# Patient Record
Sex: Female | Born: 1990 | Race: White | Hispanic: No | Smoking: Current every day smoker
Health system: Southern US, Community
[De-identification: ages and names within clinical notes are randomized; demographics above are authoritative.]

## PROBLEM LIST (undated history)

## (undated) DIAGNOSIS — D649 Anemia, unspecified: Secondary | ICD-10-CM

## (undated) DIAGNOSIS — J45909 Unspecified asthma, uncomplicated: Secondary | ICD-10-CM

## (undated) DIAGNOSIS — G43909 Migraine, unspecified, not intractable, without status migrainosus: Secondary | ICD-10-CM

## (undated) DIAGNOSIS — F99 Mental disorder, not otherwise specified: Secondary | ICD-10-CM

## (undated) DIAGNOSIS — M5430 Sciatica, unspecified side: Secondary | ICD-10-CM

## (undated) DIAGNOSIS — M549 Dorsalgia, unspecified: Secondary | ICD-10-CM

## (undated) HISTORY — PX: GANGLION CYST EXCISION: SHX1691

## (undated) HISTORY — DX: Mental disorder, not otherwise specified: F99

## (undated) HISTORY — PX: ADENOIDECTOMY: SUR15

## (undated) HISTORY — PX: TONSILLECTOMY: SUR1361

## (undated) HISTORY — DX: Anemia, unspecified: D64.9

## (undated) HISTORY — PX: MYRINGOTOMY WITH TUBE PLACEMENT: SHX5663

---
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
## 2011-02-17 ENCOUNTER — Emergency Department: Payer: Self-pay | Admitting: Emergency Medicine

## 2011-03-09 ENCOUNTER — Emergency Department: Payer: Self-pay | Admitting: Emergency Medicine

## 2011-03-19 ENCOUNTER — Emergency Department: Payer: Self-pay | Admitting: Internal Medicine

## 2011-05-08 ENCOUNTER — Emergency Department: Payer: Self-pay | Admitting: Emergency Medicine

## 2011-06-01 ENCOUNTER — Emergency Department: Payer: Self-pay | Admitting: *Deleted

## 2011-08-01 ENCOUNTER — Emergency Department: Payer: Self-pay | Admitting: Emergency Medicine

## 2011-09-15 ENCOUNTER — Emergency Department: Payer: Self-pay | Admitting: Emergency Medicine

## 2011-11-25 ENCOUNTER — Emergency Department: Payer: Self-pay | Admitting: Emergency Medicine

## 2011-11-25 LAB — PREGNANCY, URINE: Pregnancy Test, Urine: NEGATIVE m[IU]/mL

## 2011-11-27 ENCOUNTER — Emergency Department: Payer: Self-pay | Admitting: Emergency Medicine

## 2011-11-27 LAB — URINALYSIS, COMPLETE
Glucose,UR: NEGATIVE mg/dL (ref 0–75)
Nitrite: NEGATIVE
Ph: 5 (ref 4.5–8.0)
RBC,UR: 1 /HPF (ref 0–5)
Specific Gravity: 1.024 (ref 1.003–1.030)

## 2011-11-27 LAB — WET PREP, GENITAL

## 2012-03-14 ENCOUNTER — Emergency Department: Payer: Self-pay | Admitting: Emergency Medicine

## 2012-03-24 ENCOUNTER — Emergency Department: Payer: Self-pay | Admitting: Emergency Medicine

## 2012-04-03 ENCOUNTER — Emergency Department: Payer: Self-pay | Admitting: Internal Medicine

## 2012-05-12 ENCOUNTER — Emergency Department: Payer: Self-pay | Admitting: Emergency Medicine

## 2012-05-20 ENCOUNTER — Emergency Department: Payer: Self-pay | Admitting: Emergency Medicine

## 2012-05-25 ENCOUNTER — Emergency Department: Payer: Self-pay | Admitting: Emergency Medicine

## 2012-06-02 ENCOUNTER — Emergency Department: Payer: Self-pay | Admitting: Emergency Medicine

## 2012-06-02 LAB — URINALYSIS, COMPLETE
Bacteria: NONE SEEN
Nitrite: NEGATIVE
Ph: 5 (ref 4.5–8.0)
Protein: NEGATIVE
RBC,UR: 5 /HPF (ref 0–5)
Specific Gravity: 1.028 (ref 1.003–1.030)
Squamous Epithelial: 2

## 2012-06-02 LAB — PREGNANCY, URINE: Pregnancy Test, Urine: POSITIVE m[IU]/mL

## 2012-06-03 LAB — CBC WITH DIFFERENTIAL/PLATELET
Basophil %: 1.8 %
Eosinophil %: 1.7 %
HCT: 35.8 % (ref 35.0–47.0)
HGB: 12.4 g/dL (ref 12.0–16.0)
Lymphocyte #: 3.2 10*3/uL (ref 1.0–3.6)
Lymphocyte %: 31.6 %
MCHC: 34.7 g/dL (ref 32.0–36.0)
MCV: 95 fL (ref 80–100)
Monocyte #: 0.6 x10 3/mm (ref 0.2–0.9)
Monocyte %: 6 %
Neutrophil #: 6 10*3/uL (ref 1.4–6.5)
Neutrophil %: 58.9 %
RBC: 3.78 10*6/uL — ABNORMAL LOW (ref 3.80–5.20)
WBC: 10.2 10*3/uL (ref 3.6–11.0)

## 2012-06-03 LAB — COMPREHENSIVE METABOLIC PANEL
Albumin: 3.2 g/dL — ABNORMAL LOW (ref 3.4–5.0)
Alkaline Phosphatase: 56 U/L (ref 50–136)
Anion Gap: 9 (ref 7–16)
BUN: 11 mg/dL (ref 7–18)
Calcium, Total: 8.4 mg/dL — ABNORMAL LOW (ref 8.5–10.1)
Chloride: 107 mmol/L (ref 98–107)
Creatinine: 0.65 mg/dL (ref 0.60–1.30)
Glucose: 92 mg/dL (ref 65–99)
Osmolality: 280 (ref 275–301)
Potassium: 3.9 mmol/L (ref 3.5–5.1)
SGOT(AST): 20 U/L (ref 15–37)
Sodium: 141 mmol/L (ref 136–145)
Total Protein: 6.3 g/dL — ABNORMAL LOW (ref 6.4–8.2)

## 2012-06-03 LAB — HCG, QUANTITATIVE, PREGNANCY: Beta Hcg, Quant.: 95348 m[IU]/mL — ABNORMAL HIGH

## 2012-07-09 ENCOUNTER — Emergency Department: Payer: Self-pay | Admitting: Unknown Physician Specialty

## 2012-07-09 LAB — CBC
HGB: 12.4 g/dL (ref 12.0–16.0)
MCH: 32.2 pg (ref 26.0–34.0)
MCHC: 34.5 g/dL (ref 32.0–36.0)
Platelet: 169 10*3/uL (ref 150–440)
RDW: 13.5 % (ref 11.5–14.5)

## 2012-07-09 LAB — COMPREHENSIVE METABOLIC PANEL
Albumin: 3.2 g/dL — ABNORMAL LOW (ref 3.4–5.0)
Alkaline Phosphatase: 57 U/L (ref 50–136)
Anion Gap: 8 (ref 7–16)
Bilirubin,Total: 0.2 mg/dL (ref 0.2–1.0)
Calcium, Total: 8.3 mg/dL — ABNORMAL LOW (ref 8.5–10.1)
Chloride: 109 mmol/L — ABNORMAL HIGH (ref 98–107)
Co2: 21 mmol/L (ref 21–32)
EGFR (Non-African Amer.): >60
Glucose: 79 mg/dL (ref 65–99)
Osmolality: 276 (ref 275–301)
Potassium: 3.8 mmol/L (ref 3.5–5.1)
SGOT(AST): 29 U/L (ref 15–37)
Sodium: 138 mmol/L (ref 136–145)

## 2012-07-09 LAB — URINALYSIS, COMPLETE
Bacteria: NONE SEEN
Blood: NEGATIVE
Glucose,UR: NEGATIVE mg/dL (ref 0–75)
Ketone: NEGATIVE
Ph: 6 (ref 4.5–8.0)
Protein: NEGATIVE
Specific Gravity: 1.027 (ref 1.003–1.030)
Squamous Epithelial: 2
WBC UR: 24 /HPF (ref 0–5)

## 2012-07-09 LAB — HCG, QUANTITATIVE, PREGNANCY: Beta Hcg, Quant.: 26097 m[IU]/mL — ABNORMAL HIGH

## 2012-07-17 ENCOUNTER — Emergency Department: Payer: Self-pay | Admitting: Emergency Medicine

## 2012-07-17 LAB — URINALYSIS, COMPLETE
Bilirubin,UR: NEGATIVE
Ketone: NEGATIVE
Ph: 6 (ref 4.5–8.0)
Protein: NEGATIVE
RBC,UR: 1 /HPF (ref 0–5)
Specific Gravity: 1.003 (ref 1.003–1.030)
Squamous Epithelial: 2
WBC UR: 10 /HPF (ref 0–5)

## 2012-07-17 LAB — WET PREP, GENITAL

## 2012-08-09 ENCOUNTER — Emergency Department: Payer: Self-pay | Admitting: Emergency Medicine

## 2012-08-09 LAB — RAPID INFLUENZA A&B ANTIGENS

## 2012-08-11 ENCOUNTER — Emergency Department: Payer: Self-pay | Admitting: Emergency Medicine

## 2012-08-16 ENCOUNTER — Encounter: Payer: Self-pay | Admitting: Family Medicine

## 2012-10-14 ENCOUNTER — Emergency Department: Payer: Self-pay | Admitting: Internal Medicine

## 2012-10-14 LAB — URINALYSIS, COMPLETE
Blood: NEGATIVE
Glucose,UR: NEGATIVE mg/dL (ref 0–75)
Nitrite: NEGATIVE
Protein: NEGATIVE
RBC,UR: 12 /HPF (ref 0–5)
Specific Gravity: 1.024 (ref 1.003–1.030)
Squamous Epithelial: 2
WBC UR: 15 /HPF (ref 0–5)

## 2012-10-14 LAB — COMPREHENSIVE METABOLIC PANEL
Albumin: 2.6 g/dL — ABNORMAL LOW (ref 3.4–5.0)
Alkaline Phosphatase: 96 U/L (ref 50–136)
BUN: 7 mg/dL (ref 7–18)
Bilirubin,Total: 0.3 mg/dL (ref 0.2–1.0)
Calcium, Total: 7.9 mg/dL — ABNORMAL LOW (ref 8.5–10.1)
Co2: 23 mmol/L (ref 21–32)
Creatinine: 0.55 mg/dL — ABNORMAL LOW (ref 0.60–1.30)
EGFR (African American): >60
EGFR (Non-African Amer.): >60
Glucose: 92 mg/dL (ref 65–99)
Osmolality: 273 (ref 275–301)
Potassium: 3.7 mmol/L (ref 3.5–5.1)
SGPT (ALT): 16 U/L (ref 12–78)
Total Protein: 6.9 g/dL (ref 6.4–8.2)

## 2012-10-14 LAB — CBC
HCT: 37.1 % (ref 35.0–47.0)
HGB: 12.8 g/dL (ref 12.0–16.0)
MCH: 31.9 pg (ref 26.0–34.0)
MCV: 92 fL (ref 80–100)
Platelet: 142 10*3/uL — ABNORMAL LOW (ref 150–440)
RDW: 12.7 % (ref 11.5–14.5)
WBC: 11.7 10*3/uL — ABNORMAL HIGH (ref 3.6–11.0)

## 2012-10-16 LAB — URINE CULTURE

## 2012-11-17 ENCOUNTER — Emergency Department: Payer: Self-pay | Admitting: Unknown Physician Specialty

## 2012-11-17 LAB — URINALYSIS, COMPLETE
Bilirubin,UR: NEGATIVE
Blood: NEGATIVE
Ketone: NEGATIVE
Nitrite: NEGATIVE
Ph: 5 (ref 4.5–8.0)
RBC,UR: 7 /HPF (ref 0–5)
Specific Gravity: 1.025 (ref 1.003–1.030)
WBC UR: 33 /HPF (ref 0–5)

## 2012-11-19 LAB — URINE CULTURE

## 2012-12-01 ENCOUNTER — Emergency Department: Payer: Self-pay | Admitting: Emergency Medicine

## 2012-12-01 LAB — URINALYSIS, COMPLETE
Blood: NEGATIVE
Glucose,UR: NEGATIVE mg/dL (ref 0–75)
Ph: 6 (ref 4.5–8.0)
RBC,UR: 7 /HPF (ref 0–5)
WBC UR: 36 /HPF (ref 0–5)

## 2012-12-01 LAB — COMPREHENSIVE METABOLIC PANEL
Albumin: 2.4 g/dL — ABNORMAL LOW (ref 3.4–5.0)
Bilirubin,Total: 0.2 mg/dL (ref 0.2–1.0)
Chloride: 108 mmol/L — ABNORMAL HIGH (ref 98–107)
Co2: 22 mmol/L (ref 21–32)
Creatinine: 0.5 mg/dL — ABNORMAL LOW (ref 0.60–1.30)
EGFR (African American): >60
EGFR (Non-African Amer.): >60
Glucose: 85 mg/dL (ref 65–99)
Osmolality: 273 (ref 275–301)
Potassium: 3.8 mmol/L (ref 3.5–5.1)
Sodium: 138 mmol/L (ref 136–145)

## 2012-12-01 LAB — CBC
MCH: 31 pg (ref 26.0–34.0)
MCHC: 34.1 g/dL (ref 32.0–36.0)
MCV: 91 fL (ref 80–100)
RBC: 3.67 10*6/uL — ABNORMAL LOW (ref 3.80–5.20)
RDW: 12.8 % (ref 11.5–14.5)

## 2012-12-12 ENCOUNTER — Observation Stay: Payer: Self-pay

## 2012-12-16 DIAGNOSIS — F324 Major depressive disorder, single episode, in partial remission: Secondary | ICD-10-CM | POA: Insufficient documentation

## 2012-12-20 ENCOUNTER — Observation Stay: Payer: Self-pay | Admitting: Advanced Practice Midwife

## 2012-12-20 LAB — DRUG SCREEN, URINE
Barbiturates, Ur Screen: NEGATIVE (ref ?–200)
Cannabinoid 50 Ng, Ur ~~LOC~~: NEGATIVE (ref ?–50)
MDMA (Ecstasy)Ur Screen: NEGATIVE (ref ?–500)
Methadone, Ur Screen: NEGATIVE (ref ?–300)
Opiate, Ur Screen: NEGATIVE (ref ?–300)
Phencyclidine (PCP) Ur S: NEGATIVE (ref ?–25)
Tricyclic, Ur Screen: NEGATIVE (ref ?–1000)

## 2012-12-25 ENCOUNTER — Observation Stay: Payer: Self-pay | Admitting: Internal Medicine

## 2012-12-28 ENCOUNTER — Observation Stay: Payer: Self-pay | Admitting: Obstetrics and Gynecology

## 2012-12-31 ENCOUNTER — Observation Stay: Payer: Self-pay | Admitting: Emergency Medicine

## 2013-01-03 ENCOUNTER — Inpatient Hospital Stay: Payer: Self-pay | Admitting: Obstetrics and Gynecology

## 2013-01-03 LAB — CBC WITH DIFFERENTIAL/PLATELET
HCT: 34.4 % — ABNORMAL LOW (ref 35.0–47.0)
HGB: 11.9 g/dL — ABNORMAL LOW (ref 12.0–16.0)
Lymphocyte #: 2.8 10*3/uL (ref 1.0–3.6)
MCH: 30 pg (ref 26.0–34.0)
MCHC: 34.4 g/dL (ref 32.0–36.0)
MCV: 87 fL (ref 80–100)
Monocyte #: 1 x10 3/mm — ABNORMAL HIGH (ref 0.2–0.9)
Monocyte %: 7.8 %
Neutrophil %: 70 %
RBC: 3.95 10*6/uL (ref 3.80–5.20)

## 2013-01-12 ENCOUNTER — Emergency Department: Payer: Self-pay | Admitting: Internal Medicine

## 2013-02-12 ENCOUNTER — Emergency Department: Payer: Self-pay | Admitting: Emergency Medicine

## 2013-02-22 ENCOUNTER — Emergency Department: Payer: Self-pay | Admitting: Emergency Medicine

## 2013-06-27 ENCOUNTER — Emergency Department: Payer: Self-pay | Admitting: Emergency Medicine

## 2013-07-28 ENCOUNTER — Emergency Department: Payer: Self-pay | Admitting: Emergency Medicine

## 2013-07-31 LAB — BETA STREP CULTURE(ARMC)

## 2013-12-31 ENCOUNTER — Emergency Department: Payer: Self-pay | Admitting: Emergency Medicine

## 2014-01-19 ENCOUNTER — Emergency Department: Payer: Self-pay | Admitting: Emergency Medicine

## 2014-01-19 LAB — URINALYSIS, COMPLETE
BLOOD: NEGATIVE
Bilirubin,UR: NEGATIVE
GLUCOSE, UR: NEGATIVE mg/dL (ref 0–75)
KETONE: NEGATIVE
Leukocyte Esterase: NEGATIVE
Nitrite: NEGATIVE
Ph: 7 (ref 4.5–8.0)
Protein: NEGATIVE
RBC,UR: 1 /HPF (ref 0–5)
SPECIFIC GRAVITY: 1.026 (ref 1.003–1.030)
Squamous Epithelial: 2

## 2014-01-19 LAB — CBC WITH DIFFERENTIAL/PLATELET
BASOS ABS: 0.1 10*3/uL (ref 0.0–0.1)
Basophil %: 0.7 %
EOS ABS: 0.2 10*3/uL (ref 0.0–0.7)
Eosinophil %: 1.7 %
HCT: 40.8 % (ref 35.0–47.0)
HGB: 13.5 g/dL (ref 12.0–16.0)
Lymphocyte #: 3.2 10*3/uL (ref 1.0–3.6)
Lymphocyte %: 34.8 %
MCH: 30.6 pg (ref 26.0–34.0)
MCHC: 33.2 g/dL (ref 32.0–36.0)
MCV: 92 fL (ref 80–100)
Monocyte #: 0.7 x10 3/mm (ref 0.2–0.9)
Monocyte %: 7.6 %
NEUTROS ABS: 5 10*3/uL (ref 1.4–6.5)
Neutrophil %: 55.2 %
Platelet: 186 10*3/uL (ref 150–440)
RBC: 4.42 10*6/uL (ref 3.80–5.20)
RDW: 13.5 % (ref 11.5–14.5)
WBC: 9.1 10*3/uL (ref 3.6–11.0)

## 2014-01-19 LAB — COMPREHENSIVE METABOLIC PANEL
ANION GAP: 7 (ref 7–16)
AST: 18 U/L (ref 15–37)
Albumin: 3.8 g/dL (ref 3.4–5.0)
Alkaline Phosphatase: 75 U/L
BUN: 11 mg/dL (ref 7–18)
Bilirubin,Total: 0.3 mg/dL (ref 0.2–1.0)
CHLORIDE: 106 mmol/L (ref 98–107)
CREATININE: 0.78 mg/dL (ref 0.60–1.30)
Calcium, Total: 8.8 mg/dL (ref 8.5–10.1)
Co2: 26 mmol/L (ref 21–32)
EGFR (African American): >60
Glucose: 100 mg/dL — ABNORMAL HIGH (ref 65–99)
Osmolality: 277 (ref 275–301)
Potassium: 3.8 mmol/L (ref 3.5–5.1)
SGPT (ALT): 22 U/L (ref 12–78)
Sodium: 139 mmol/L (ref 136–145)
Total Protein: 7.1 g/dL (ref 6.4–8.2)

## 2014-03-04 ENCOUNTER — Emergency Department: Payer: Self-pay | Admitting: Emergency Medicine

## 2014-03-06 ENCOUNTER — Emergency Department: Payer: Self-pay | Admitting: Emergency Medicine

## 2014-03-07 ENCOUNTER — Emergency Department: Payer: Self-pay | Admitting: Internal Medicine

## 2014-03-23 ENCOUNTER — Emergency Department: Payer: Self-pay | Admitting: Emergency Medicine

## 2014-05-04 ENCOUNTER — Emergency Department: Payer: Self-pay | Admitting: Student

## 2014-07-27 ENCOUNTER — Emergency Department: Payer: Self-pay | Admitting: Student

## 2014-07-27 LAB — URINALYSIS, COMPLETE
Bilirubin,UR: NEGATIVE
GLUCOSE, UR: NEGATIVE mg/dL (ref 0–75)
KETONE: NEGATIVE
NITRITE: NEGATIVE
PH: 5 (ref 4.5–8.0)
Protein: 30
SPECIFIC GRAVITY: 1.035 (ref 1.003–1.030)
Squamous Epithelial: 33

## 2014-07-29 LAB — URINE CULTURE

## 2014-10-24 ENCOUNTER — Emergency Department: Payer: Self-pay | Admitting: Emergency Medicine

## 2014-12-02 ENCOUNTER — Emergency Department
Admit: 2014-12-02 | Disposition: A | Discharge status: Home / Self Care | Payer: Self-pay | Admitting: Emergency Medicine

## 2014-12-03 LAB — CBC WITH DIFFERENTIAL/PLATELET
BASOS ABS: 0.1 10*3/uL (ref 0.0–0.1)
BASOS PCT: 0.6 %
Eosinophil #: 0.3 10*3/uL (ref 0.0–0.7)
Eosinophil %: 2.7 %
HCT: 43.2 % (ref 35.0–47.0)
HGB: 14.8 g/dL (ref 12.0–16.0)
LYMPHS ABS: 3.9 10*3/uL — AB (ref 1.0–3.6)
Lymphocyte %: 38.5 %
MCH: 31.8 pg (ref 26.0–34.0)
MCHC: 34.1 g/dL (ref 32.0–36.0)
MCV: 93 fL (ref 80–100)
Monocyte #: 0.9 x10 3/mm (ref 0.2–0.9)
Monocyte %: 8.7 %
NEUTROS ABS: 5.1 10*3/uL (ref 1.4–6.5)
Neutrophil %: 49.5 %
Platelet: 206 10*3/uL (ref 150–440)
RBC: 4.64 10*6/uL (ref 3.80–5.20)
RDW: 13.8 % (ref 11.5–14.5)
WBC: 10.3 10*3/uL (ref 3.6–11.0)

## 2014-12-03 LAB — URINALYSIS, COMPLETE
Bilirubin,UR: NEGATIVE
Glucose,UR: NEGATIVE mg/dL (ref 0–75)
Ketone: NEGATIVE
Leukocyte Esterase: NEGATIVE
NITRITE: NEGATIVE
PH: 6 (ref 4.5–8.0)
PROTEIN: NEGATIVE
SPECIFIC GRAVITY: 1.023 (ref 1.003–1.030)

## 2014-12-03 LAB — CSF CELL COUNT WITH DIFFERENTIAL
CSF TUBE #: 1
CSF TUBE #: 3
EOS PCT: 0 %
EOS PCT: 0 %
LYMPHS PCT: 79 %
Lymphocytes: 79 %
MONOCYTES/MACROPHAGES: 21 %
Monocytes/Macrophages: 21 %
Neutrophils: 0 %
Neutrophils: 0 %
Other Cells: 0 %
Other Cells: 0 %
RBC (CSF): 0 /mm3
RBC (CSF): 12 /mm3
WBC (CSF): 5 /mm3
WBC (CSF): 5 /mm3

## 2014-12-03 LAB — COMPREHENSIVE METABOLIC PANEL
ALBUMIN: 4.3 g/dL
ALT: 21 U/L
AST: 21 U/L
Alkaline Phosphatase: 66 U/L
Anion Gap: 3 — ABNORMAL LOW (ref 7–16)
BUN: 13 mg/dL
Bilirubin,Total: 0.3 mg/dL
CHLORIDE: 107 mmol/L
CREATININE: 0.78 mg/dL
Calcium, Total: 9 mg/dL
Co2: 28 mmol/L
EGFR (Non-African Amer.): >60
GLUCOSE: 91 mg/dL
Potassium: 3.8 mmol/L
Sodium: 138 mmol/L
TOTAL PROTEIN: 7.5 g/dL

## 2014-12-03 LAB — PROTEIN, CSF: PROTEIN, CSF: 21 mg/dL

## 2014-12-03 LAB — TROPONIN I: Troponin-I: <0.03 ng/mL

## 2014-12-03 LAB — GLUCOSE, CSF: GLUCOSE, CSF: 61 mg/dL (ref 40–70)

## 2014-12-06 DIAGNOSIS — J4520 Mild intermittent asthma, uncomplicated: Secondary | ICD-10-CM | POA: Insufficient documentation

## 2014-12-06 LAB — CSF CULTURE

## 2014-12-19 NOTE — H&P (Signed)
L&D Evaluation:  History Expanded:  HPI 24 yo G3P102511mat 39weeks and a few days, who is having contractions and episode of bleeding this am. No ROM.  She has felt the baby move today. she is contracting very infrequently,she is RH neg, tdap given 10/19/12.   Gravida 3   Term 1   PreTerm 0   Abortion 1   Living 1   Blood Type (Maternal) A negative   Group B Strep Results Maternal (Result >5wks must be treated as unknown) negative   Maternal HIV Negative   Maternal Syphilis Ab Nonreactive   Maternal Varicella Immune   Rubella Results (Maternal) immune   Maternal T-Dap Immune   [facility]   Presents with abdominal pain   Patient's Medical History bipolar   Patient's Surgical History none   Medications Pre Natal Vitamins  risperdal up to 12/13   Allergies ceclor, keflex, omnicef   Social History tobacco  drugs   Family History Non-Contributory   ROS:  ROS All systems were reviewed.  HEENT, CNS, GI, GU, Respiratory, CV, Renal and Musculoskeletal systems were found to be normal.   Exam:  Vital Signs stable   Urine Protein not completed   General no apparent distress   Mental Status clear   Chest clear   Heart normal sinus rhythm   Abdomen gravid, tender with contractions   Estimated Fetal Weight Average for gestational age   Fetal Position vertex   Back no CVAT   Edema no edema   Pelvic no external lesions, 2/50/-2, no blood   Mebranes Intact   FHT normal rate with no decels, cat 1   Fetal Heart Rate 140   Ucx irregular   Skin dry   Lymph no lymphadenopathy   Impression:  Impression early labor, vs false labor, no signs of significant bleeding, Fetal Well-being Reassuring   Plan:  Plan EFM/NST, monitor contractions and for cervical change   Electronic Signatures: Letitia LibraHarris, Hanley Woerner Paul (MD)  (Signed 23-May-14 14:58)  Authored: L&D Evaluation   Last Updated: 23-May-14 14:58 by Letitia LibraHarris, Sharni Negron Paul (MD)

## 2014-12-19 NOTE — H&P (Signed)
L&D Evaluation:  History:  HPI 24 year old G3 P1011 with EDC=01/05/13 by a 9 1/7 week ultrasound presents to L&D with decreased FM and pelvic pain today. Prenatal care at [facility] remarkable for bipolar disorder, obesity, normal anatomy scan, RH negative (Rhogam given 10/19/12), MJ use in early pregnancy, and tobacco use. Past OB Hx: SVD 7# female in 2010.   Presents with decreased fetal movement, and pelvic pain   Patient's Medical History Asthma  Bipolar disorder (was on Risperdal through 07/2012), obesity   Patient's Surgical History T&A   Medications Pre Natal Vitamins   Allergies ceclor, Keflex, Omnicef, Latex.   Social History tobacco  drugs  Hx of MJ and cocaine use. Last cocaine in 2012. MJ in early pregnancy. Stopped smoking 3 mos ago.   Family History Non-Contributory   ROS:  ROS see HPI   Exam:  Vital Signs stable  122/60   Urine Protein not completed   General no apparent distress   Abdomen gravid, tenderness in LUS when doing Leopolds   Estimated Fetal Weight Average for gestational age   Fetal Position cephalic   Pelvic no external lesions, 1.5/50%/-1   Mebranes Intact   FHT normal rate with no decels, 150 baseline with accels to 180s   FHT Description mod variability   Fetal Heart Rate 152   Ucx irregular   Skin dry   Impression:  Impression IUP at  36 4/7 weeks with reactive NST. Pelvic pain probably R/T  lower station of baby   Plan:  Plan DC home with labor precations. Continue FKCs daily.   Comments ROB appt 8 May at [facility]   Electronic Signatures: Trinna BalloonGutierrez, Devra Stare L (CNM)  (Signed [phone]-May-14 21:23)  Authored: L&D Evaluation   Last Updated: 04-May-14 21:23 by Trinna BalloonGutierrez, Tunya Held L (CNM)

## 2014-12-19 NOTE — H&P (Signed)
L&D Evaluation:  History Expanded:  HPI 24 yo G3P103211mat 38weeks and a few days, who is having pelvic pressure and pain. She has felt the baby move today but she says it is decreased now. she is on pain and has been there for a few weeks. she is contracting very infrequently,she is RH neg, tdap given 10/19/12. hx of DVis bipolar,   Gravida 3   Term 1   PreTerm 0   Abortion 1   Living 1   Blood Type (Maternal) A negative   Group B Strep Results Maternal (Result >5wks must be treated as unknown) negative   Maternal HIV Negative   Maternal Syphilis Ab Nonreactive   Maternal Varicella Immune   Rubella Results (Maternal) immune   Maternal T-Dap Immune   [facility]   Presents with abdominal pain   Patient's Medical History bipolar   Patient's Surgical History none   Medications Pre Natal Vitamins  risperdal up to 12/13   Allergies ceclor, keflex, omnicef   Social History tobacco  drugs   Family History Non-Contributory   ROS:  ROS All systems were reviewed.  HEENT, CNS, GI, GU, Respiratory, CV, Renal and Musculoskeletal systems were found to be normal.   Exam:  Vital Signs stable   Urine Protein not completed   General no apparent distress   Mental Status clear   Chest clear   Heart normal sinus rhythm   Abdomen gravid, tender with contractions   Estimated Fetal Weight Average for gestational age   Fetal Position v   Back no CVAT   Edema no edema   Pelvic no external lesions   Mebranes Intact   FHT normal rate with no decels, cat 1   Fetal Heart Rate 140   Ucx irregular   Skin dry   Lymph no lymphadenopathy   Impression:  Impression early labor, latent ;abor   Plan:  Plan EFM/NST   Comments give morphine 10 mg im x 1   Follow Up Appointment need to schedule. already scheduled   Electronic Signatures: Adria DevonKlett, Khamil Lamica (MD)  (Signed 20-May-14 23:24)  Authored: L&D Evaluation   Last Updated: 20-May-14 23:24 by Adria DevonKlett,  Luva Metzger (MD)

## 2015-02-11 ENCOUNTER — Encounter: Payer: Self-pay | Admitting: Emergency Medicine

## 2015-02-11 DIAGNOSIS — S8001XA Contusion of right knee, initial encounter: Secondary | ICD-10-CM | POA: Insufficient documentation

## 2015-02-11 DIAGNOSIS — Y9289 Other specified places as the place of occurrence of the external cause: Secondary | ICD-10-CM | POA: Insufficient documentation

## 2015-02-11 DIAGNOSIS — W01198A Fall on same level from slipping, tripping and stumbling with subsequent striking against other object, initial encounter: Secondary | ICD-10-CM | POA: Insufficient documentation

## 2015-02-11 DIAGNOSIS — Z720 Tobacco use: Secondary | ICD-10-CM | POA: Insufficient documentation

## 2015-02-11 DIAGNOSIS — Y9389 Activity, other specified: Secondary | ICD-10-CM | POA: Insufficient documentation

## 2015-02-11 DIAGNOSIS — Y998 Other external cause status: Secondary | ICD-10-CM | POA: Insufficient documentation

## 2015-02-11 NOTE — ED Notes (Addendum)
Pt c/o right knee pain following a fall on Friday; wearing a knee brace which she had at home from previous knee injuries; ambulatory with steady gait wearing brace

## 2015-02-12 ENCOUNTER — Emergency Department: Payer: Self-pay

## 2015-02-12 ENCOUNTER — Emergency Department
Admission: EM | Admit: 2015-02-12 | Discharge: 2015-02-12 | Disposition: A | Discharge status: Home / Self Care | Payer: Self-pay | Attending: Emergency Medicine | Admitting: Emergency Medicine

## 2015-02-12 DIAGNOSIS — M25561 Pain in right knee: Secondary | ICD-10-CM

## 2015-02-12 DIAGNOSIS — S8001XA Contusion of right knee, initial encounter: Secondary | ICD-10-CM

## 2015-02-12 HISTORY — DX: Unspecified asthma, uncomplicated: J45.909

## 2015-02-12 HISTORY — DX: Migraine, unspecified, not intractable, without status migrainosus: G43.909

## 2015-02-12 MED ORDER — HYDROCODONE-ACETAMINOPHEN 5-325 MG PO TABS
1.0000 | ORAL_TABLET | Freq: Once | ORAL | Status: AC
  Administered 2015-02-12: 1 via ORAL

## 2015-02-12 MED ORDER — IBUPROFEN 800 MG PO TABS: 800.0000 mg | ORAL_TABLET | Freq: Once | ORAL | Status: DC

## 2015-02-12 MED ORDER — HYDROCODONE-ACETAMINOPHEN 5-325 MG PO TABS
ORAL_TABLET | ORAL | Status: AC
  Filled 2015-02-12: qty 1

## 2015-02-12 MED ORDER — IBUPROFEN 800 MG PO TABS: 800.0000 mg | ORAL_TABLET | Freq: Three times a day (TID) | ORAL | Status: DC | PRN

## 2015-02-12 MED ORDER — IBUPROFEN 800 MG PO TABS
ORAL_TABLET | ORAL | Status: AC
  Administered 2015-02-12: 800 mg
  Filled 2015-02-12: qty 1

## 2015-02-12 MED ORDER — HYDROCODONE-ACETAMINOPHEN 5-325 MG PO TABS: 1.0000 | ORAL_TABLET | Freq: Four times a day (QID) | ORAL | Status: DC | PRN

## 2015-02-12 NOTE — ED Provider Notes (Signed)
[facility] Emergency Department Provider Note  ____________________________________________  Time seen: Approximately 12:21 AM  I have reviewed the triage vital signs and the nursing notes.   HISTORY  Chief Complaint Knee Pain    HPI NOGA FOGG is a 24 y.o. femalewho presents to the ED from home complaining of right knee pain s/p a fall 2 days ago. Patient states she tripped over a bouncy seat while holding HER-75-monthold infant and fell, striking her knees. Complains of persistent 5/10 pain to the anterior and posterior right knee since. Patient presents wearing a knee brace which she owns from previous knee injuries. Denies striking head or LOC, neck pain, chest pain, shortness breath, abdominal pain, headache. Nothing makes the pain better. Movement makes the pain worse.   Past Medical History  Diagnosis Date  . Asthma   . Migraine     There are no active problems to display for this patient.   Past Surgical History  Procedure Laterality Date  . Tonsillectomy    . Adenoidectomy    . Myringotomy with tube placement        Allergies Ceclor and Keflex  History reviewed. No pertinent family history.  Social History History  Substance Use Topics  . Smoking status: Current Every Day Smoker    Types: Cigarettes  . Smokeless tobacco: Never Used  . Alcohol Use: Yes     Comment: twice weekly    Review of Systems Constitutional: No fever/chills Eyes: No visual changes. ENT: No sore throat. Cardiovascular: Denies chest pain. Respiratory: Denies shortness of breath. Gastrointestinal: No abdominal pain.  No nausea, no vomiting.  No diarrhea.  No constipation. Genitourinary: Negative for dysuria. Musculoskeletal: Positive for right knee pain. Negative for back pain. Skin: Negative for rash. Neurological: Negative for headaches, focal weakness or numbness.  10-point ROS otherwise  negative.  ____________________________________________   PHYSICAL EXAM:  VITAL SIGNS: ED Triage Vitals  Enc Vitals Group     BP 02/11/15 2130 101/65 mmHg     Pulse Rate 02/11/15 2130 69     Resp 02/11/15 2130 18     Temp 02/11/15 2130 98.2 F (36.8 C)     Temp Source 02/11/15 2130 Oral     SpO2 02/11/15 2130 100 %     Weight 02/11/15 2130 235 lb (106.595 kg)     Height 02/11/15 2130 _0  (1.651 m)     Head Cir --      Peak Flow --      Pain Score 02/11/15 2131 7     Pain Loc --      Pain Edu? --      Excl. in [place] --     Constitutional: Alert and oriented. Well appearing and in no acute distress. Eyes: Conjunctivae are normal. PERRL. EOMI. Head: Atraumatic. Nose: No congestion/rhinnorhea. Mouth/Throat: Mucous membranes are moist.  Oropharynx non-erythematous. Neck: No stridor.   Cardiovascular: Normal rate, regular rhythm. Grossly normal heart sounds.  Good peripheral circulation. Respiratory: Normal respiratory effort.  No retractions. Lungs CTAB. Gastrointestinal: Soft and nontender. No distention. No abdominal bruits. No CVA tenderness. Musculoskeletal: Right knee is tender to palpation anteriorly without joint effusion. Full range of movement with mild pain. Neurologic:  Normal speech and language. No gross focal neurologic deficits are appreciated. Speech is normal.  Skin:  Skin is warm, dry and intact. No rash noted. Psychiatric: Mood and affect are normal. Speech and behavior are normal.  ____________________________________________   LABS (all labs ordered are listed, but only  abnormal results are displayed)  Labs Reviewed - No data to display ____________________________________________  EKG  None ____________________________________________  RADIOLOGY  Right knee x-rays (viewed by me, interpreted per Dr. Pascal Lux): Negative. ____________________________________________   PROCEDURES  Procedure(s) performed: None  Critical Care performed:  No  ____________________________________________   INITIAL IMPRESSION / ASSESSMENT AND PLAN / ED COURSE  Pertinent labs & imaging results that were available during my care of the patient were reviewed by me and considered in my medical decision making (see chart for details).  24 year old female who presents with right knee pain following a fall 2 days prior. Will obtain x-rays; administer NSAIDs and analgesia.  ----------------------------------------- 1:14 AM on 02/12/2015 -----------------------------------------  Patient feeling better. Discussed with her plan for analgesia, NSAIDs, [place], orthopedics follow-up. Strict return precautions given. Patient verbalizes understanding and agrees with plan of care. ____________________________________________   FINAL CLINICAL IMPRESSION(S) / ED DIAGNOSES  Final diagnoses:  Knee contusion, right, initial encounter  Knee pain, acute, right      Paulette Blanch, MD 02/12/15 [phone]

## 2015-02-12 NOTE — Discharge Instructions (Signed)
1. Take pain medicines as needed (Motrin/Norco #15). 2. Continue knee brace as needed for stability and comfort. 3. Elevate affected area and apply ice several times daily. 4. Return to the ER for worsening symptoms, increased swelling, weakness or other concerns.  Knee Pain The knee is the complex joint between your thigh and your lower leg. It is made up of bones, tendons, ligaments, and cartilage. The bones that make up the knee are:  The femur in the thigh.  The tibia and fibula in the lower leg.  The patella or kneecap riding in the groove on the lower femur. CAUSES  Knee pain is a common complaint with many causes. A few of these causes are:  Injury, such as:  A ruptured ligament or tendon injury.  Torn cartilage.  Medical conditions, such as:  Gout  Arthritis  Infections  Overuse, over training, or overdoing a physical activity. Knee pain can be minor or severe. Knee pain can accompany debilitating injury. Minor knee problems often respond well to self-care measures or get well on their own. More serious injuries may need medical intervention or even surgery. SYMPTOMS The knee is complex. Symptoms of knee problems can vary widely. Some of the problems are:  Pain with movement and weight bearing.  Swelling and tenderness.  Buckling of the knee.  Inability to straighten or extend your knee.  Your knee locks and you cannot straighten it.  Warmth and redness with pain and fever.  Deformity or dislocation of the kneecap. DIAGNOSIS  Determining what is wrong may be very straight forward such as when there is an injury. It can also be challenging because of the complexity of the knee. Tests to make a diagnosis may include:  Your caregiver taking a history and doing a physical exam.  Routine X-rays can be used to rule out other problems. X-rays will not reveal a cartilage tear. Some injuries of the knee can be diagnosed by:  Arthroscopy a surgical technique by  which a small video camera is inserted through tiny incisions on the sides of the knee. This procedure is used to examine and repair internal knee joint problems. Tiny instruments can be used during arthroscopy to repair the torn knee cartilage (meniscus).  Arthrography is a radiology technique. A contrast liquid is directly injected into the knee joint. Internal structures of the knee joint then become visible on X-ray film.  An MRI scan is a non X-ray radiology procedure in which magnetic fields and a computer produce two- or three-dimensional images of the inside of the knee. Cartilage tears are often visible using an MRI scanner. MRI scans have largely replaced arthrography in diagnosing cartilage tears of the knee.  Blood work.  Examination of the fluid that helps to lubricate the knee joint (synovial fluid). This is done by taking a sample out using a needle and a syringe. TREATMENT The treatment of knee problems depends on the cause. Some of these treatments are:  Depending on the injury, proper casting, splinting, surgery, or physical therapy care will be needed.  Give yourself adequate recovery time. Do not overuse your joints. If you begin to get sore during workout routines, back off. Slow down or do fewer repetitions.  For repetitive activities such as cycling or running, maintain your strength and nutrition.  Alternate muscle groups. For example, if you are a weight lifter, work the upper body on one day and the lower body the next.  Either tight or weak muscles do not give the proper  support for your knee. Tight or weak muscles do not absorb the stress placed on the knee joint. Keep the muscles surrounding the knee strong.  Take care of mechanical problems.  If you have flat feet, orthotics or special shoes may help. See your caregiver if you need help.  Arch supports, sometimes with wedges on the inner or outer aspect of the heel, can help. These can shift pressure away from  the side of the knee most bothered by osteoarthritis.  A brace called an "unloader" brace also may be used to help ease the pressure on the most arthritic side of the knee.  If your caregiver has prescribed crutches, braces, wraps or ice, use as directed. The acronym for this is PRICE. This means protection, rest, ice, compression, and elevation.  Nonsteroidal anti-inflammatory drugs (NSAIDs), can help relieve pain. But if taken immediately after an injury, they may actually increase swelling. Take NSAIDs with food in your stomach. Stop them if you develop stomach problems. Do not take these if you have a history of ulcers, stomach pain, or bleeding from the bowel. Do not take without your caregiver's approval if you have problems with fluid retention, heart failure, or kidney problems.  For ongoing knee problems, physical therapy may be helpful.  Glucosamine and chondroitin are over-the-counter dietary supplements. Both may help relieve the pain of osteoarthritis in the knee. These medicines are different from the usual anti-inflammatory drugs. Glucosamine may decrease the rate of cartilage destruction.  Injections of a corticosteroid drug into your knee joint may help reduce the symptoms of an arthritis flare-up. They may provide pain relief that lasts a few months. You may have to wait a few months between injections. The injections do have a small increased risk of infection, water retention, and elevated blood sugar levels.  Hyaluronic acid injected into damaged joints may ease pain and provide lubrication. These injections may work by reducing inflammation. A series of shots may give relief for as long as 6 months.  Topical painkillers. Applying certain ointments to your skin may help relieve the pain and stiffness of osteoarthritis. Ask your pharmacist for suggestions. Many over the-counter products are approved for temporary relief of arthritis pain.  In some countries, doctors often  prescribe topical NSAIDs for relief of chronic conditions such as arthritis and tendinitis. A review of treatment with NSAID creams found that they worked as well as oral medications but without the serious side effects. PREVENTION  Maintain a healthy weight. Extra pounds put more strain on your joints.  Get strong, stay limber. Weak muscles are a common cause of knee injuries. Stretching is important. Include flexibility exercises in your workouts.  Be smart about exercise. If you have osteoarthritis, chronic knee pain or recurring injuries, you may need to change the way you exercise. This does not mean you have to stop being active. If your knees ache after jogging or playing basketball, consider switching to swimming, water aerobics, or other low-impact activities, at least for a few days a week. Sometimes limiting high-impact activities will provide relief.  Make sure your shoes fit well. Choose footwear that is right for your sport.  Protect your knees. Use the proper gear for knee-sensitive activities. Use kneepads when playing volleyball or laying carpet. Buckle your seat belt every time you drive. Most shattered kneecaps occur in car accidents.  Rest when you are tired. SEEK MEDICAL CARE IF:  You have knee pain that is continual and does not seem to be getting better.  SEEK IMMEDIATE MEDICAL CARE IF:  Your knee joint feels hot to the touch and you have a high fever. MAKE SURE YOU:   Understand these instructions.  Will watch your condition.  Will get help right away if you are not doing well or get worse. Document Released: 05/25/2007 Document Revised: 10/20/2011 Document Reviewed: 05/25/2007 [facility] Patient Information 2015 [place], [place]. This information is not intended to replace advice given to you by your health care provider. Make sure you discuss any questions you have with your health care provider.  Contusion A contusion is a deep bruise. Contusions are the result of  an injury that caused bleeding under the skin. The contusion may turn blue, purple, or yellow. Minor injuries will give you a painless contusion, but more severe contusions may stay painful and swollen for a few weeks.  CAUSES  A contusion is usually caused by a blow, trauma, or direct force to an area of the body. SYMPTOMS   Swelling and redness of the injured area.  Bruising of the injured area.  Tenderness and soreness of the injured area.  Pain. DIAGNOSIS  The diagnosis can be made by taking a history and physical exam. An X-ray, CT scan, or MRI may be needed to determine if there were any associated injuries, such as fractures. TREATMENT  Specific treatment will depend on what area of the body was injured. In general, the best treatment for a contusion is resting, icing, elevating, and applying cold compresses to the injured area. Over-the-counter medicines may also be recommended for pain control. Ask your caregiver what the best treatment is for your contusion. HOME CARE INSTRUCTIONS   Put ice on the injured area.  Put ice in a plastic bag.  Place a towel between your skin and the bag.  Leave the ice on for 15-20 minutes, 3-4 times a day, or as directed by your health care provider.  Only take over-the-counter or prescription medicines for pain, discomfort, or fever as directed by your caregiver. Your caregiver may recommend avoiding anti-inflammatory medicines (aspirin, ibuprofen, and naproxen) for 48 hours because these medicines may increase bruising.  Rest the injured area.  If possible, elevate the injured area to reduce swelling. SEEK IMMEDIATE MEDICAL CARE IF:   You have increased bruising or swelling.  You have pain that is getting worse.  Your swelling or pain is not relieved with medicines. MAKE SURE YOU:   Understand these instructions.  Will watch your condition.  Will get help right away if you are not doing well or get worse. Document Released:  05/07/2005 Document Revised: 08/02/2013 Document Reviewed: 06/02/2011 [facility] Patient Information 2015 [place], [place]. This information is not intended to replace advice given to you by your health care provider. Make sure you discuss any questions you have with your health care provider.

## 2015-02-12 NOTE — ED Notes (Signed)
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

## 2015-04-09 ENCOUNTER — Encounter: Payer: Self-pay | Admitting: Emergency Medicine

## 2015-04-09 ENCOUNTER — Emergency Department
Admission: EM | Admit: 2015-04-09 | Discharge: 2015-04-09 | Disposition: A | Discharge status: Home / Self Care | Payer: Self-pay | Attending: Emergency Medicine | Admitting: Emergency Medicine

## 2015-04-09 ENCOUNTER — Emergency Department: Payer: Self-pay

## 2015-04-09 DIAGNOSIS — M5440 Lumbago with sciatica, unspecified side: Secondary | ICD-10-CM | POA: Insufficient documentation

## 2015-04-09 DIAGNOSIS — Z91040 Latex allergy status: Secondary | ICD-10-CM | POA: Insufficient documentation

## 2015-04-09 DIAGNOSIS — Z720 Tobacco use: Secondary | ICD-10-CM | POA: Insufficient documentation

## 2015-04-09 DIAGNOSIS — Z3202 Encounter for pregnancy test, result negative: Secondary | ICD-10-CM | POA: Insufficient documentation

## 2015-04-09 DIAGNOSIS — M5136 Other intervertebral disc degeneration, lumbar region: Secondary | ICD-10-CM | POA: Insufficient documentation

## 2015-04-09 HISTORY — DX: Sciatica, unspecified side: M54.30

## 2015-04-09 HISTORY — DX: Dorsalgia, unspecified: M54.9

## 2015-04-09 LAB — URINALYSIS COMPLETE WITH MICROSCOPIC (ARMC ONLY)
BILIRUBIN URINE: NEGATIVE
Glucose, UA: NEGATIVE mg/dL
Hgb urine dipstick: NEGATIVE
Leukocytes, UA: NEGATIVE
NITRITE: NEGATIVE
Protein, ur: NEGATIVE mg/dL
SPECIFIC GRAVITY, URINE: 1.03 (ref 1.005–1.030)
pH: 5 (ref 5.0–8.0)

## 2015-04-09 LAB — CBC WITH DIFFERENTIAL/PLATELET
Basophils Absolute: 0.1 10*3/uL (ref 0–0.1)
Basophils Relative: 1 %
EOS PCT: 2 %
Eosinophils Absolute: 0.2 10*3/uL (ref 0–0.7)
HCT: 39.4 % (ref 35.0–47.0)
Hemoglobin: 13.3 g/dL (ref 12.0–16.0)
LYMPHS ABS: 3.3 10*3/uL (ref 1.0–3.6)
Lymphocytes Relative: 32 %
MCH: 31.3 pg (ref 26.0–34.0)
MCHC: 33.9 g/dL (ref 32.0–36.0)
MCV: 92.3 fL (ref 80.0–100.0)
MONO ABS: 0.7 10*3/uL (ref 0.2–0.9)
Monocytes Relative: 6 %
NEUTROS ABS: 6.2 10*3/uL (ref 1.4–6.5)
Neutrophils Relative %: 59 %
PLATELETS: 180 10*3/uL (ref 150–440)
RBC: 4.27 MIL/uL (ref 3.80–5.20)
RDW: 13.6 % (ref 11.5–14.5)
WBC: 10.6 10*3/uL (ref 3.6–11.0)

## 2015-04-09 LAB — BASIC METABOLIC PANEL
Anion gap: 4 — ABNORMAL LOW (ref 5–15)
BUN: 18 mg/dL (ref 6–20)
CHLORIDE: 109 mmol/L (ref 101–111)
CO2: 26 mmol/L (ref 22–32)
Calcium: 8.5 mg/dL — ABNORMAL LOW (ref 8.9–10.3)
Creatinine, Ser: 0.88 mg/dL (ref 0.44–1.00)
GFR calc Af Amer: >60 mL/min (ref >60)
GFR calc non Af Amer: >60 mL/min (ref >60)
Glucose, Bld: 114 mg/dL — ABNORMAL HIGH (ref 65–99)
Potassium: 3.8 mmol/L (ref 3.5–5.1)
Sodium: 139 mmol/L (ref 135–145)

## 2015-04-09 LAB — POCT PREGNANCY, URINE: Preg Test, Ur: NEGATIVE

## 2015-04-09 MED ORDER — DIAZEPAM 5 MG/ML IJ SOLN
5.0000 mg | Freq: Once | INTRAMUSCULAR | Status: AC
  Administered 2015-04-09: 5 mg via INTRAVENOUS
  Filled 2015-04-09: qty 2

## 2015-04-09 MED ORDER — KETOROLAC TROMETHAMINE 30 MG/ML IJ SOLN
60.0000 mg | Freq: Once | INTRAMUSCULAR | Status: DC
  Filled 2015-04-09: qty 2

## 2015-04-09 MED ORDER — NAPROXEN 500 MG PO TBEC: 500.0000 mg | DELAYED_RELEASE_TABLET | Freq: Two times a day (BID) | ORAL | Status: DC

## 2015-04-09 MED ORDER — CYCLOBENZAPRINE HCL 5 MG PO TABS: 5.0000 mg | ORAL_TABLET | Freq: Three times a day (TID) | ORAL | Status: DC | PRN

## 2015-04-09 MED ORDER — KETOROLAC TROMETHAMINE 30 MG/ML IJ SOLN
30.0000 mg | Freq: Once | INTRAMUSCULAR | Status: AC
  Administered 2015-04-09: 30 mg via INTRAVENOUS

## 2015-04-09 NOTE — ED Notes (Signed)
Pt arrived to the ED for complaints of numbness in her trunk and legs. Pt states that she is experiencing lumbar and sacral back pain  With a hx of siatica pain. Pt is AOx4 in no apparent distress.

## 2015-04-09 NOTE — Discharge Instructions (Signed)
Back Pain, Adult °Low back pain is very common. About 1 in 5 people have back pain. The cause of low back pain is rarely dangerous. The pain often gets better over time. About half of people with a sudden onset of back pain feel better in just 2 weeks. About 8 in 10 people feel better by 6 weeks.  °CAUSES °Some common causes of back pain include: °· Strain of the muscles or ligaments supporting the spine. °· Wear and tear (degeneration) of the spinal discs. °· Arthritis. °· Direct injury to the back. °DIAGNOSIS °Most of the time, the direct cause of low back pain is not known. However, back pain can be treated effectively even when the exact cause of the pain is unknown. Answering your caregiver's questions about your overall health and symptoms is one of the most accurate ways to make sure the cause of your pain is not dangerous. If your caregiver needs more information, he or she may order lab work or imaging tests (X-rays or MRIs). However, even if imaging tests show changes in your back, this usually does not require surgery. °HOME CARE INSTRUCTIONS °For many people, back pain returns. Since low back pain is rarely dangerous, it is often a condition that people can learn to manage on their own.  °· Remain active. It is stressful on the back to sit or stand in one place. Do not sit, drive, or stand in one place for more than 30 minutes at a time. Take short walks on level surfaces as soon as pain allows. Try to increase the length of time you walk each day. °· Do not stay in bed. Resting more than 1 or 2 days can delay your recovery. °· Do not avoid exercise or work. Your body is made to move. It is not dangerous to be active, even though your back may hurt. Your back will likely heal faster if you return to being active before your pain is gone. °· Pay attention to your body when you  bend and lift. Many people have less discomfort when lifting if they bend their knees, keep the load close to their bodies, and  avoid twisting. Often, the most comfortable positions are those that put less stress on your recovering back. °· Find a comfortable position to sleep. Use a firm mattress and lie on your side with your knees slightly bent. If you lie on your back, put a pillow under your knees. °· Only take over-the-counter or prescription medicines as directed by your caregiver. Over-the-counter medicines to reduce pain and inflammation are often the most helpful. Your caregiver may prescribe muscle relaxant drugs. These medicines help dull your pain so you can more quickly return to your normal activities and healthy exercise. °· Put ice on the injured area. °· Put ice in a plastic bag. °· Place a towel between your skin and the bag. °· Leave the ice on for 15-20 minutes, 03-04 times a day for the first 2 to 3 days. After that, ice and heat may be alternated to reduce pain and spasms. °· Ask your caregiver about trying back exercises and gentle massage. This may be of some benefit. °· Avoid feeling anxious or stressed. Stress increases muscle tension and can worsen back pain. It is important to recognize when you are anxious or stressed and learn ways to manage it. Exercise is a great option. °SEEK MEDICAL CARE IF: °· You have pain that is not relieved with rest or medicine. °· You have pain that does not improve in 1 week. °· You have new symptoms. °· You are generally not feeling well. °SEEK   IMMEDIATE MEDICAL CARE IF:  °· You have pain that radiates from your back into your legs. °· You develop new bowel or bladder control problems. °· You have unusual weakness or numbness in your arms or legs. °· You develop nausea or vomiting. °· You develop abdominal pain. °· You feel faint. °Document Released: 07/28/2005 Document Revised: 01/27/2012 Document Reviewed: 11/29/2013 °ExitCare® Patient Information ©2015 [employer]. This information is not intended to replace advice given to you by your health care provider. Make sure you  discuss any questions you have with your health care provider. ° °Radicular Pain °Radicular pain in either the arm or leg is usually from a bulging or herniated disk in the spine. A piece of the herniated disk may press against the nerves as the nerves exit the spine. This causes pain which is felt at the tips of the nerves down the arm or leg. Other causes of radicular pain may include: °· Fractures. °· Heart disease. °· Cancer. °· An abnormal and usually degenerative state of the nervous system or nerves (neuropathy). °Diagnosis may require CT or MRI scanning to determine the primary cause.  °Nerves that start at the neck (nerve roots) may cause radicular pain in the outer shoulder and arm. It can spread down to the thumb and fingers. The symptoms vary depending on which nerve root has been affected. In most cases radicular pain improves with conservative treatment. Neck problems may require physical therapy, a neck collar, or cervical traction. Treatment may take many weeks, and surgery may be considered if the symptoms do not improve.  °Conservative treatment is also recommended for sciatica. Sciatica causes pain to radiate from the lower back or buttock area down the leg into the foot. Often there is a history of back problems. Most patients with sciatica are better after 2 to 4 weeks of rest and other supportive care. Short term bed rest can reduce the disk pressure considerably. Sitting, however, is not a good position since this increases the pressure on the disk. You should avoid bending, lifting, and all other activities which make the problem worse. Traction can be used in severe cases. Surgery is usually reserved for patients who do not improve within the first months of treatment. °Only take over-the-counter or prescription medicines for pain, discomfort, or fever as directed by your caregiver. Narcotics and muscle relaxants may help by relieving more severe pain and spasm and by providing mild  sedation. Cold or massage can give significant relief. Spinal manipulation is not recommended. It can increase the degree of disc protrusion. Epidural steroid injections are often effective treatment for radicular pain. These injections deliver medicine to the spinal nerve in the space between the protective covering of the spinal cord and back bones (vertebrae). Your caregiver can give you more information about steroid injections. These injections are most effective when given within two weeks of the onset of pain.  °You should see your caregiver for follow up care as recommended. A program for neck and back injury rehabilitation with stretching and strengthening exercises is an important part of management.  °SEEK IMMEDIATE MEDICAL CARE IF: °· You develop increased pain, weakness, or numbness in your arm or leg. °· You develop difficulty with bladder or bowel control. °· You develop abdominal pain. °Document Released: 09/04/2004 Document Revised: 10/20/2011 Document Reviewed: 11/20/2008 °ExitCare® Patient Information ©2015 [employer]. This information is not intended to replace advice given to you by your health care provider. Make sure you discuss any questions you have with   your health care provider. ° °

## 2015-04-09 NOTE — ED Provider Notes (Signed)
[facility] Emergency Department Provider Note  ____________________________________________  Time seen: Approximately 8:27 PM  I have reviewed the triage vital signs and the nursing notes.   HISTORY  Chief Complaint Numbness    HPI Samantha Mendez is a 24 y.o. female who presents for complaints of numbness in her trunk and legs progressively getting worse for the last 3 days. Patient states she is experiencing some lumbar and sacral back pain with a history of sciatica.   Past Medical History  Diagnosis Date  . Asthma   . Migraine   . Back pain   . Sciatic leg pain     There are no active problems to display for this patient.   Past Surgical History  Procedure Laterality Date  . Tonsillectomy    . Adenoidectomy    . Myringotomy with tube placement    . Adenoidectomy      Current Outpatient Rx  Name  Route  Sig  Dispense  Refill  . cyclobenzaprine (FLEXERIL) 5 MG tablet   Oral   Take 1 tablet (5 mg total) by mouth every 8 (eight) hours as needed for muscle spasms.   30 tablet   0   . naproxen (EC NAPROSYN) 500 MG EC tablet   Oral   Take 1 tablet (500 mg total) by mouth 2 (two) times daily with a meal.   60 tablet   0     Allergies Ceclor; Keflex; Latex; and Omnicef  History reviewed. No pertinent family history.  Social History Social History  Substance Use Topics  . Smoking status: Current Every Day Smoker    Types: Cigarettes  . Smokeless tobacco: Never Used  . Alcohol Use: Yes     Comment: twice weekly    Review of Systems Constitutional: No fever/chills Eyes: No visual changes. ENT: No sore throat. Cardiovascular: Denies chest pain. Respiratory: Denies shortness of breath. Gastrointestinal: No abdominal pain.  No nausea, no vomiting.  No diarrhea.  No constipation. Genitourinary: Negative for dysuria. Musculoskeletal: Positive for low back pain with bilateral lower extremity numbness. Skin: Negative for  rash. Neurological: Negative for headaches, focal weakness, positive for bilateral leg numbness.  10-point ROS otherwise negative.  ____________________________________________   PHYSICAL EXAM:  VITAL SIGNS: ED Triage Vitals  Enc Vitals Group     BP 04/09/15 2008 102/67 mmHg     Pulse --      Resp 04/09/15 2008 18     Temp 04/09/15 2008 98.3 F (36.8 C)     Temp Source 04/09/15 2008 Oral     SpO2 04/09/15 2008 100 %     Weight 04/09/15 2008 235 lb (106.595 kg)     Height 04/09/15 2008 5\' 5"  (1.651 m)     Head Cir --      Peak Flow --      Pain Score 04/09/15 2009 7     Pain Loc --      Pain Edu? --      Excl. in GC? --     Constitutional: Alert and oriented. Well appearing and in no acute distress. Eyes: Conjunctivae are normal. PERRL. EOMI. Head: Atraumatic. Nose: No congestion/rhinnorhea. Mouth/Throat: Mucous membranes are moist.  Oropharynx non-erythematous. Neck: No stridor.   Cardiovascular: Normal rate, regular rhythm. Grossly normal heart sounds.  Good peripheral circulation. Respiratory: Normal respiratory effort.  No retractions. Lungs CTAB. Gastrointestinal: Soft and nontender. No distention. No abdominal bruits. No CVA tenderness. Musculoskeletal: No lower extremity tenderness nor edema.  No joint  effusions. Positive straight leg raise bilaterally at approximately 20. Negative spinal tenderness never negative pelvic tenderness negative bladder tenderness.  Neurologic:  Normal speech and language. No gross focal neurologic deficits are appreciated. No gait instability. Skin:  Skin is warm, dry and intact. No rash noted. Psychiatric: Mood and affect are normal. Speech and behavior are normal.  ____________________________________________   LABS (all labs ordered are listed, but only abnormal results are displayed)  Labs Reviewed  URINALYSIS COMPLETEWITH MICROSCOPIC ([facility] ONLY) - Abnormal; Notable for the following:    Color, Urine YELLOW (*)     APPearance CLEAR (*)    Ketones, ur TRACE (*)    Bacteria, UA RARE (*)    Squamous Epithelial / LPF 0-5 (*)    All other components within normal limits  BASIC METABOLIC PANEL - Abnormal; Notable for the following:    Glucose, Bld 114 (*)    Calcium 8.5 (*)    Anion gap 4 (*)    All other components within normal limits  URINE CULTURE  CBC WITH DIFFERENTIAL/PLATELET  POC URINE PREG, ED  POCT PREGNANCY, URINE   ____________________________________________   RADIOLOGY  Degenerative disc disease. By radiologist and reviewed by myself ____________________________________________   PROCEDURES  Procedure(s) performed: None  Critical Care performed: No  ____________________________________________   INITIAL IMPRESSION / ASSESSMENT AND PLAN / ED COURSE  Pertinent labs & imaging results that were available during my care of the patient were reviewed by me and considered in my medical decision making (see chart for details).  Degenerative disc disease. Patient has current orthopedic doctor that she sees on a regular basis. Started on Flexeril 5 mg and Naprosyn 500 mg. Patient voices no other emergency medical complaints at this time and will return to the ER with any worsening symptomology. ____________________________________________   FINAL CLINICAL IMPRESSION(S) / ED DIAGNOSES  Final diagnoses:  Low back pain of thoracolumbar region with sciatica  Degenerative disc disease, lumbar      Evangeline Dakin, PA-C 04/09/15 2200  Myrna Blazer, MD 04/10/15 [phone]

## 2015-04-11 LAB — URINE CULTURE: Special Requests: NORMAL

## 2015-06-04 ENCOUNTER — Emergency Department
Admission: EM | Admit: 2015-06-04 | Discharge: 2015-06-04 | Disposition: A | Discharge status: Home / Self Care | Payer: Self-pay | Attending: Emergency Medicine | Admitting: Emergency Medicine

## 2015-06-04 ENCOUNTER — Encounter: Payer: Self-pay | Admitting: Emergency Medicine

## 2015-06-04 DIAGNOSIS — Y998 Other external cause status: Secondary | ICD-10-CM | POA: Insufficient documentation

## 2015-06-04 DIAGNOSIS — Z720 Tobacco use: Secondary | ICD-10-CM | POA: Insufficient documentation

## 2015-06-04 DIAGNOSIS — S8391XA Sprain of unspecified site of right knee, initial encounter: Secondary | ICD-10-CM | POA: Insufficient documentation

## 2015-06-04 DIAGNOSIS — W1839XA Other fall on same level, initial encounter: Secondary | ICD-10-CM | POA: Insufficient documentation

## 2015-06-04 DIAGNOSIS — Z91040 Latex allergy status: Secondary | ICD-10-CM | POA: Insufficient documentation

## 2015-06-04 DIAGNOSIS — Y9289 Other specified places as the place of occurrence of the external cause: Secondary | ICD-10-CM | POA: Insufficient documentation

## 2015-06-04 DIAGNOSIS — Y9389 Activity, other specified: Secondary | ICD-10-CM | POA: Insufficient documentation

## 2015-06-04 MED ORDER — IBUPROFEN 800 MG PO TABS
800.0000 mg | ORAL_TABLET | Freq: Three times a day (TID) | ORAL | Status: DC
Start: 2015-06-04 — End: 2016-01-02

## 2015-06-04 NOTE — ED Provider Notes (Signed)
[facility] Emergency Department Provider Note  ____________________________________________  Time seen: Approximately 1:24 PM  I have reviewed the triage vital signs and the nursing notes.   HISTORY  Chief Complaint Knee Injury   HPI Samantha Mendez is a 24 y.o. female is here with complaint of right knee pain. Patient states that she is at [facility] with her knee in the past and was going physical therapy and also seeing a orthopedist at [facility]. Patient states that when she fell she landed more on her hip and did not strike her knee. She states that physical therapy was helping her knee however because of insurance problems she is not in. She states that she continues doing her exercises that she was given at home. Currently she rates her pain a 7 out of 10.  Past Medical History  Diagnosis Date  . Asthma   . Migraine   . Back pain   . Sciatic leg pain     There are no active problems to display for this patient.   Past Surgical History  Procedure Laterality Date  . Tonsillectomy    . Adenoidectomy    . Myringotomy with tube placement    . Adenoidectomy      Current Outpatient Rx  Name  Route  Sig  Dispense  Refill  . cyclobenzaprine (FLEXERIL) 5 MG tablet   Oral   Take 1 tablet (5 mg total) by mouth every 8 (eight) hours as needed for muscle spasms.   30 tablet   0   . ibuprofen (ADVIL,MOTRIN) 800 MG tablet   Oral   Take 1 tablet (800 mg total) by mouth 3 (three) times daily.   30 tablet   0   . naproxen (EC NAPROSYN) 500 MG EC tablet   Oral   Take 1 tablet (500 mg total) by mouth 2 (two) times daily with a meal.   60 tablet   0     Allergies Ceclor; Keflex; Latex; and Omnicef  No family history on file.  Social History Social History  Substance Use Topics  . Smoking status: Current Every Day Smoker    Types: Cigarettes  . Smokeless tobacco: Never Used  . Alcohol Use: Yes     Comment: twice weekly    Review of  Systems Constitutional: No fever/chills Cardiovascular: Denies chest pain. Respiratory: Denies shortness of breath. Gastrointestinal:   No nausea, no vomiting.  No diarrhea.  Musculoskeletal: Negative for back pain. Skin: Negative for rash. Neurological: Negative for headaches, focal weakness or numbness.  10-point ROS otherwise negative.  ____________________________________________   PHYSICAL EXAM:  VITAL SIGNS: ED Triage Vitals  Enc Vitals Group     BP 06/04/15 1239 119/67 mmHg     Pulse Rate 06/04/15 1237 76     Resp 06/04/15 1237 20     Temp 06/04/15 1237 98.2 F (36.8 C)     Temp Source 06/04/15 1237 Oral     SpO2 06/04/15 1237 97 %     Weight 06/04/15 1237 235 lb (106.595 kg)     Height 06/04/15 1237  (1.651 m)     Head Cir --      Peak Flow --      Pain Score 06/04/15 1237 7     Pain Loc --      Pain Edu? --      Excl. in GC? --     Constitutional: Alert and oriented. Well appearing and in no acute distress. Eyes: Conjunctivae are  normal. PERRL. EOMI. Head: Atraumatic. Nose: No congestion/rhinnorhea. Neck: No stridor.   Cardiovascular: Normal rate, regular rhythm. Grossly normal heart sounds.  Good peripheral circulation. Respiratory: Normal respiratory effort.  No retractions. Lungs CTAB. Gastrointestinal: Soft and nontender. No distention.  Musculoskeletal: Right knee no gross deformity. There is no effusion. No edema was present. Neurologic:  Normal speech and language. No gross focal neurologic deficits are appreciated. No gait instability. Skin:  Skin is warm, dry and intact. No rash noted. No ecchymosis or abrasions were noted. Psychiatric: Mood and affect are normal. Speech and behavior are normal.  ____________________________________________   LABS (all labs ordered are listed, but only abnormal results are displayed)  Labs Reviewed - No data to display  PROCEDURES  Procedure(s) performed: None  Critical Care performed:  No  ____________________________________________   INITIAL IMPRESSION / ASSESSMENT AND PLAN / ED COURSE  Pertinent labs & imaging results that were available during my care of the patient were reviewed by me and considered in my medical decision making (see chart for details).  Patient is to follow-up with his physical therapy and [facility]. She will continue doing her knee exercises at home. She is encouraged to use ice as needed for pain. ____________________________________________   FINAL CLINICAL IMPRESSION(S) / ED DIAGNOSES  Final diagnoses:  Sprain of right knee, initial encounter      Tommi RumpsRhonda L Jomo Forand, PA-C 06/04/15 1452  Emily FilbertJonathan E Williams, MD 06/04/15 [phone]

## 2015-06-04 NOTE — Discharge Instructions (Signed)
Cryotherapy Cryotherapy is when you put ice on your injury. Ice helps lessen pain and puffiness (swelling) after an injury. Ice works the best when you start using it in the first 24 to 48 hours after an injury. HOME CARE  Put a dry or damp towel between the ice pack and your skin.  You may press gently on the ice pack.  Leave the ice on for no more than 10 to 20 minutes at a time.  Check your skin after 5 minutes to make sure your skin is okay.  Rest at least 20 minutes between ice pack uses.  Stop using ice when your skin loses feeling (numbness).  Do not use ice on someone who cannot tell you when it hurts. This includes small children and people with memory problems (dementia). GET HELP RIGHT AWAY IF:  You have white spots on your skin.  Your skin turns blue or pale.  Your skin feels waxy or hard.  Your puffiness gets worse. MAKE SURE YOU:   Understand these instructions.  Will watch your condition.  Will get help right away if you are not doing well or get worse.   This information is not intended to replace advice given to you by your health care provider. Make sure you discuss any questions you have with your health care provider.   Document Released: 01/14/2008 Document Revised: 10/20/2011 Document Reviewed: 03/20/2011 Elsevier Interactive Patient Education [employer]2016 [employer]    [facility]. Ice and elevate as needed. Ibuprofen 3 times a day with food for pain and inflammation.

## 2015-06-04 NOTE — ED Notes (Signed)
Fell Friday right knee pain.

## 2015-08-13 ENCOUNTER — Emergency Department
Admission: EM | Admit: 2015-08-13 | Discharge: 2015-08-13 | Disposition: A | Discharge status: Home / Self Care | Payer: Medicaid Other | Attending: Emergency Medicine | Admitting: Emergency Medicine

## 2015-08-13 DIAGNOSIS — R519 Headache, unspecified: Secondary | ICD-10-CM

## 2015-08-13 DIAGNOSIS — R51 Headache: Secondary | ICD-10-CM

## 2015-08-13 DIAGNOSIS — G43909 Migraine, unspecified, not intractable, without status migrainosus: Secondary | ICD-10-CM | POA: Insufficient documentation

## 2015-08-13 DIAGNOSIS — M545 Low back pain: Secondary | ICD-10-CM | POA: Insufficient documentation

## 2015-08-13 DIAGNOSIS — Z91040 Latex allergy status: Secondary | ICD-10-CM | POA: Insufficient documentation

## 2015-08-13 DIAGNOSIS — Z791 Long term (current) use of non-steroidal anti-inflammatories (NSAID): Secondary | ICD-10-CM | POA: Insufficient documentation

## 2015-08-13 DIAGNOSIS — Z3202 Encounter for pregnancy test, result negative: Secondary | ICD-10-CM | POA: Insufficient documentation

## 2015-08-13 DIAGNOSIS — G8929 Other chronic pain: Secondary | ICD-10-CM | POA: Insufficient documentation

## 2015-08-13 DIAGNOSIS — F17210 Nicotine dependence, cigarettes, uncomplicated: Secondary | ICD-10-CM | POA: Insufficient documentation

## 2015-08-13 LAB — COMPREHENSIVE METABOLIC PANEL
ALT: 23 U/L (ref 14–54)
ANION GAP: 6 (ref 5–15)
AST: 26 U/L (ref 15–41)
Albumin: 4.3 g/dL (ref 3.5–5.0)
Alkaline Phosphatase: 52 U/L (ref 38–126)
BUN: 15 mg/dL (ref 6–20)
CHLORIDE: 107 mmol/L (ref 101–111)
CO2: 25 mmol/L (ref 22–32)
Calcium: 8.9 mg/dL (ref 8.9–10.3)
Creatinine, Ser: 0.96 mg/dL (ref 0.44–1.00)
Glucose, Bld: 103 mg/dL — ABNORMAL HIGH (ref 65–99)
POTASSIUM: 3.7 mmol/L (ref 3.5–5.1)
SODIUM: 138 mmol/L (ref 135–145)
Total Bilirubin: 0.4 mg/dL (ref 0.3–1.2)
Total Protein: 7.1 g/dL (ref 6.5–8.1)

## 2015-08-13 LAB — PREGNANCY, URINE: Preg Test, Ur: NEGATIVE

## 2015-08-13 LAB — CBC
HCT: 39 % (ref 35.0–47.0)
Hemoglobin: 13.5 g/dL (ref 12.0–16.0)
MCH: 31.9 pg (ref 26.0–34.0)
MCHC: 34.7 g/dL (ref 32.0–36.0)
MCV: 91.9 fL (ref 80.0–100.0)
PLATELETS: 190 10*3/uL (ref 150–440)
RBC: 4.24 MIL/uL (ref 3.80–5.20)
RDW: 13.7 % (ref 11.5–14.5)
WBC: 11.1 10*3/uL — ABNORMAL HIGH (ref 3.6–11.0)

## 2015-08-13 LAB — URINALYSIS COMPLETE WITH MICROSCOPIC (ARMC ONLY)
Bacteria, UA: NONE SEEN
Bilirubin Urine: NEGATIVE
GLUCOSE, UA: NEGATIVE mg/dL
Hgb urine dipstick: NEGATIVE
KETONES UR: NEGATIVE mg/dL
Leukocytes, UA: NEGATIVE
Nitrite: NEGATIVE
PROTEIN: NEGATIVE mg/dL
SPECIFIC GRAVITY, URINE: 1.002 — AB (ref 1.005–1.030)
pH: 6 (ref 5.0–8.0)

## 2015-08-13 MED ORDER — KETOROLAC TROMETHAMINE 30 MG/ML IJ SOLN
INTRAMUSCULAR | Status: AC
  Administered 2015-08-13: 30 mg via INTRAVENOUS
  Filled 2015-08-13: qty 1

## 2015-08-13 MED ORDER — DIPHENHYDRAMINE HCL 50 MG/ML IJ SOLN
INTRAMUSCULAR | Status: AC
  Administered 2015-08-13: 50 mg via INTRAVENOUS
  Filled 2015-08-13: qty 1

## 2015-08-13 MED ORDER — METOCLOPRAMIDE HCL 5 MG/ML IJ SOLN
10.0000 mg | Freq: Once | INTRAMUSCULAR | Status: AC
  Administered 2015-08-13: 10 mg via INTRAVENOUS

## 2015-08-13 MED ORDER — KETOROLAC TROMETHAMINE 30 MG/ML IJ SOLN
30.0000 mg | Freq: Once | INTRAMUSCULAR | Status: AC
  Administered 2015-08-13: 30 mg via INTRAVENOUS

## 2015-08-13 MED ORDER — DIPHENHYDRAMINE HCL 50 MG/ML IJ SOLN
50.0000 mg | Freq: Once | INTRAMUSCULAR | Status: AC
  Administered 2015-08-13: 50 mg via INTRAVENOUS

## 2015-08-13 MED ORDER — SODIUM CHLORIDE 0.9 % IV BOLUS (SEPSIS)
1000.0000 mL | Freq: Once | INTRAVENOUS | Status: AC
  Administered 2015-08-13: 1000 mL via INTRAVENOUS

## 2015-08-13 MED ORDER — METOCLOPRAMIDE HCL 5 MG/ML IJ SOLN
INTRAMUSCULAR | Status: AC
  Administered 2015-08-13: 10 mg via INTRAVENOUS
  Filled 2015-08-13: qty 2

## 2015-08-13 MED ORDER — BUTALBITAL-APAP-CAFFEINE 50-325-40 MG PO TABS: 1.0000 | ORAL_TABLET | Freq: Four times a day (QID) | ORAL | Status: DC | PRN

## 2015-08-13 NOTE — ED Provider Notes (Signed)
[facility] Emergency Department Provider Note  Time seen: 8:01 PM  I have reviewed the triage vital signs and the nursing notes.   HISTORY  Chief Complaint Headache    HPI Samantha Mendez is a 25 y.o. female with a past medical history migraines, chronic back pain, asthma, who presents the emergency department with 4 weeks of migraine headache. According to the patient for the past 4 weeks she has had an intermittent headache which will sometimes go away within returns. States she has once had a migraine last 8 weeks. She has been taken her home medications which include Imitrex without relief. She also states over the past week she has had lower back pain. States a history of lower back pain but this feels somewhat different. She states it feels like it's in her kidneys. Denies any dysuria, urinary frequency or cloudy urine. Denies any fever, nausea, vomiting or diarrhea. Describes the headache as moderate to severe associated with photophobia. States it feels like her typical migraine.    Past Medical History  Diagnosis Date  . Asthma   . Migraine   . Back pain   . Sciatic leg pain     There are no active problems to display for this patient.   Past Surgical History  Procedure Laterality Date  . Tonsillectomy    . Adenoidectomy    . Myringotomy with tube placement    . Adenoidectomy      Current Outpatient Rx  Name  Route  Sig  Dispense  Refill  . cyclobenzaprine (FLEXERIL) 5 MG tablet   Oral   Take 1 tablet (5 mg total) by mouth every 8 (eight) hours as needed for muscle spasms.   30 tablet   0   . ibuprofen (ADVIL,MOTRIN) 800 MG tablet   Oral   Take 1 tablet (800 mg total) by mouth 3 (three) times daily.   30 tablet   0   . naproxen (EC NAPROSYN) 500 MG EC tablet   Oral   Take 1 tablet (500 mg total) by mouth 2 (two) times daily with a meal.   60 tablet   0     Allergies Ceclor; Keflex; Latex; and Omnicef  No family history on  file.  Social History Social History  Substance Use Topics  . Smoking status: Current Every Day Smoker    Types: Cigarettes  . Smokeless tobacco: Never Used  . Alcohol Use: Yes     Comment: twice weekly    Review of Systems Constitutional: Negative for fever. Cardiovascular: Negative for chest pain. Respiratory: Negative for shortness of breath. Gastrointestinal: Negative for abdominal pain Genitourinary: Negative for dysuria. Musculoskeletal: Positive for lower back pain. Neurological: Negative for headache 10-point ROS otherwise negative.  ____________________________________________   PHYSICAL EXAM:  VITAL SIGNS: ED Triage Vitals  Enc Vitals Group     BP 08/13/15 1922 123/69 mmHg     Pulse Rate 08/13/15 1922 79     Resp 08/13/15 1922 18     Temp 08/13/15 1922 97.8 F (36.6 C)     Temp Source 08/13/15 1922 Oral     SpO2 08/13/15 1922 99 %     Weight 08/13/15 1922 244 lb (110.678 kg)     Height 08/13/15 1922 5\' 5"  (1.651 m)     Head Cir --      Peak Flow --      Pain Score 08/13/15 1922 7     Pain Loc --  Pain Edu? --      Excl. in GC? --     Constitutional: Alert and oriented. Well appearing and in no distress. Eyes: Normal exam ENT   Head: Normocephalic and atraumatic.   Mouth/Throat: Mucous membranes are moist. Cardiovascular: Normal rate, regular rhythm. No murmur Respiratory: Normal respiratory effort without tachypnea nor retractions. Breath sounds are clear and equal bilaterally. No wheezes/rales/rhonchi. Gastrointestinal: Soft and nontender. No distention.  No CVA tenderness Musculoskeletal: Nontender with normal range of motion in all extremities.  Neurologic:  Normal speech and language. No gross focal neurologic deficits. Equal grip strengths bilaterally. No pronator drift. Cranial nerves intact. Mild photophobia on exam the pupils are 3 mm equal and reactive. Skin:  Skin is warm, dry and intact.  Psychiatric: Mood and affect are normal.  Speech and behavior are normal.  ____________________________________________   INITIAL IMPRESSION / ASSESSMENT AND PLAN / ED COURSE  Pertinent labs & imaging results that were available during my care of the patient were reviewed by me and considered in my medical decision making (see chart for details).  Patient presents for a prolonged migraine headache. States the headache feels like her typical migraine headache but is lasting longer than usual, however this is not the longest one is lasted before. [facility]. Patient also states lower back pain. Largely nontender back exam. No CVA tenderness. We will check labs, treat the patient's headache, and IV hydrate.  Patient states her headache is now a 0/10, completely gone. We'll discharge home. Labs are largely within normal limits.  ____________________________________________   FINAL CLINICAL IMPRESSION(S) / ED DIAGNOSES  Migraine headache Lower back pain   Minna AntisKevin Fatema Rabe, MD 08/13/15 2319

## 2015-08-13 NOTE — Discharge Instructions (Signed)
Please follow-up with her primary care physician within the next 1-2 days for recheck. Return to the emergency department for any worsening headache, confusion, slurred speech or weakness or numbness of any arm or leg.   General Headache Without Cause A headache is pain or discomfort felt around the head or neck area. There are many causes and types of headaches. In some cases, the cause may not be found.  HOME CARE  Managing Pain  Take over-the-counter and prescription medicines only as told by your doctor.  Lie down in a dark, quiet room when you have a headache.  If directed, apply ice to the head and neck area:  Put ice in a plastic bag.  Place a towel between your skin and the bag.  Leave the ice on for 20 minutes, 2-3 times per day.  Use a heating pad or hot shower to apply heat to the head and neck area as told by your doctor.  Keep lights dim if bright lights bother you or make your headaches worse. Eating and Drinking  Eat meals on a regular schedule.  Lessen how much alcohol you drink.  Lessen how much caffeine you drink, or stop drinking caffeine. General Instructions  Keep all follow-up visits as told by your doctor. This is important.  Keep a journal to find out if certain things bring on headaches. For example, write down:  What you eat and drink.  How much sleep you get.  Any change to your diet or medicines.  Relax by getting a massage or doing other relaxing activities.  Lessen stress.  Sit up straight. Do not tighten (tense) your muscles.  Do not use tobacco products. This includes cigarettes, chewing tobacco, or e-cigarettes. If you need help quitting, ask your doctor.  Exercise regularly as told by your doctor.  Get enough sleep. This often means 7-9 hours of sleep. GET HELP IF:  Your symptoms are not helped by medicine.  You have a headache that feels different than the other headaches.  You feel sick to your stomach (nauseous) or you  throw up (vomit).  You have a fever. GET HELP RIGHT AWAY IF:   Your headache becomes really bad.  You keep throwing up.  You have a stiff neck.  You have trouble seeing.  You have trouble speaking.  You have pain in the eye or ear.  Your muscles are weak or you lose muscle control.  You lose your balance or have trouble walking.  You feel like you will pass out (faint) or you pass out.  You have confusion.   This information is not intended to replace advice given to you by your health care provider. Make sure you discuss any questions you have with your health care provider.   Document Released: 05/06/2008 Document Revised: 04/18/2015 Document Reviewed: 11/20/2014 Elsevier Interactive Patient Education [employer]2016 Elsevier Inc.

## 2015-08-13 NOTE — ED Notes (Addendum)
Pt c/o "migraine headache x 1 month". Pt reports taking prescriptions w/o relief.    08/13/15 2000  Neurological  Neuro (WDL) X  Level of Consciousness Alert  Orientation Level Oriented X4  Cognition Appropriate at baseline  Speech Clear

## 2015-08-13 NOTE — ED Notes (Signed)
Pt in with co migraine for few weeks and low back pain, states unsure of urinary frequency due to increased water intake.

## 2015-09-04 ENCOUNTER — Encounter: Payer: Self-pay | Admitting: Emergency Medicine

## 2015-09-04 ENCOUNTER — Emergency Department
Admission: EM | Admit: 2015-09-04 | Discharge: 2015-09-04 | Disposition: A | Discharge status: Home / Self Care | Payer: Medicaid Other | Attending: Emergency Medicine | Admitting: Emergency Medicine

## 2015-09-04 DIAGNOSIS — F17210 Nicotine dependence, cigarettes, uncomplicated: Secondary | ICD-10-CM | POA: Insufficient documentation

## 2015-09-04 DIAGNOSIS — Z79899 Other long term (current) drug therapy: Secondary | ICD-10-CM | POA: Insufficient documentation

## 2015-09-04 DIAGNOSIS — Z91040 Latex allergy status: Secondary | ICD-10-CM | POA: Insufficient documentation

## 2015-09-04 DIAGNOSIS — Z791 Long term (current) use of non-steroidal anti-inflammatories (NSAID): Secondary | ICD-10-CM | POA: Insufficient documentation

## 2015-09-04 DIAGNOSIS — Z3202 Encounter for pregnancy test, result negative: Secondary | ICD-10-CM | POA: Insufficient documentation

## 2015-09-04 DIAGNOSIS — G43809 Other migraine, not intractable, without status migrainosus: Secondary | ICD-10-CM

## 2015-09-04 LAB — URINALYSIS COMPLETE WITH MICROSCOPIC (ARMC ONLY)
BACTERIA UA: NONE SEEN
Bilirubin Urine: NEGATIVE
Glucose, UA: NEGATIVE mg/dL
HGB URINE DIPSTICK: NEGATIVE
Ketones, ur: NEGATIVE mg/dL
LEUKOCYTES UA: NEGATIVE
NITRITE: NEGATIVE
PH: 6 (ref 5.0–8.0)
PROTEIN: NEGATIVE mg/dL
Specific Gravity, Urine: 1.01 (ref 1.005–1.030)

## 2015-09-04 LAB — POCT PREGNANCY, URINE: PREG TEST UR: NEGATIVE

## 2015-09-04 MED ORDER — PROCHLORPERAZINE EDISYLATE 5 MG/ML IJ SOLN
INTRAMUSCULAR | Status: AC
  Administered 2015-09-04: 10 mg via INTRAVENOUS
  Filled 2015-09-04: qty 2

## 2015-09-04 MED ORDER — KETOROLAC TROMETHAMINE 30 MG/ML IJ SOLN
30.0000 mg | Freq: Once | INTRAMUSCULAR | Status: AC
  Administered 2015-09-04: 30 mg via INTRAVENOUS
  Filled 2015-09-04: qty 1

## 2015-09-04 MED ORDER — PROCHLORPERAZINE MALEATE 10 MG PO TABS: 10.0000 mg | ORAL_TABLET | Freq: Three times a day (TID) | ORAL | Status: DC | PRN

## 2015-09-04 MED ORDER — PROCHLORPERAZINE EDISYLATE 5 MG/ML IJ SOLN
10.0000 mg | Freq: Four times a day (QID) | INTRAMUSCULAR | Status: DC | PRN
  Administered 2015-09-04: 10 mg via INTRAVENOUS
  Filled 2015-09-04: qty 2

## 2015-09-04 MED ORDER — SODIUM CHLORIDE 0.9 % IV BOLUS (SEPSIS)
1000.0000 mL | Freq: Once | INTRAVENOUS | Status: AC
  Administered 2015-09-04: 1000 mL via INTRAVENOUS

## 2015-09-04 NOTE — ED Notes (Signed)
Last  Seen in ED  For same complaint 08/20/15.

## 2015-09-04 NOTE — ED Provider Notes (Signed)
[facility] Emergency Department Provider Note   ____________________________________________  Time seen: 2115  I have reviewed the triage vital signs and the nursing notes.   HISTORY  Chief Complaint Headache   History limited by: Not Limited   HPI Samantha Mendez is a 25 y.o. female with history of migraines who presents to the emergency department today because of headache. The patient states this feels like her normal headache. It is located in the right side of the head but it does radiate back into her neck. She has had some associated photophobia. Some nausea. The patient states that she was recently seen in this emergency Department for the same type of headache. At that time she got roughly 4-5 days of relief after treatment came back. She last saw her headache doctors at [facility] in December. She states that she is tried oral medications without great relief. No fevers. No trauma to the head.     Past Medical History  Diagnosis Date  . Asthma   . Migraine   . Back pain   . Sciatic leg pain     There are no active problems to display for this patient.   Past Surgical History  Procedure Laterality Date  . Tonsillectomy    . Adenoidectomy    . Myringotomy with tube placement    . Adenoidectomy      Current Outpatient Rx  Name  Route  Sig  Dispense  Refill  . butalbital-acetaminophen-caffeine (FIORICET) 50-325-40 MG tablet   Oral   Take 1-2 tablets by mouth every 6 (six) hours as needed for headache.   20 tablet   0   . cyclobenzaprine (FLEXERIL) 5 MG tablet   Oral   Take 1 tablet (5 mg total) by mouth every 8 (eight) hours as needed for muscle spasms.   30 tablet   0   . ibuprofen (ADVIL,MOTRIN) 800 MG tablet   Oral   Take 1 tablet (800 mg total) by mouth 3 (three) times daily.   30 tablet   0   . naproxen (EC NAPROSYN) 500 MG EC tablet   Oral   Take 1 tablet (500 mg total) by mouth 2 (two) times daily with a meal.   60  tablet   0   . prochlorperazine (COMPAZINE) 10 MG tablet   Oral   Take 1 tablet (10 mg total) by mouth every 8 (eight) hours as needed (headache).   20 tablet   0     Allergies Ceclor; Keflex; Latex; and Omnicef  No family history on file.  Social History Social History  Substance Use Topics  . Smoking status: Current Every Day Smoker    Types: Cigarettes  . Smokeless tobacco: Never Used  . Alcohol Use: Yes     Comment: twice weekly    Review of Systems  Constitutional: Negative for fever. Cardiovascular: Negative for chest pain. Respiratory: Negative for shortness of breath. Gastrointestinal: Negative for abdominal pain, vomiting and diarrhea. Neurological: Positive for headache  10-point ROS otherwise negative.  ____________________________________________   PHYSICAL EXAM:  VITAL SIGNS: ED Triage Vitals  Enc Vitals Group     BP 09/04/15 2010 121/67 mmHg     Pulse Rate 09/04/15 2010 80     Resp 09/04/15 2010 18     Temp 09/04/15 2010 98.5 F (36.9 C)     Temp Source 09/04/15 2010 Oral     SpO2 09/04/15 2010 98 %     Weight 09/04/15 2010 234 lb (  106.142 kg)     Height 09/04/15 2010  (1.651 m)     Head Cir --      Peak Flow --      Pain Score 09/04/15 2010 8   Constitutional: Alert and oriented. Well appearing and in no distress. Eyes: Conjunctivae are normal. PERRL. Normal extraocular movements. ENT   Head: Normocephalic and atraumatic.   Nose: No congestion/rhinnorhea.   Mouth/Throat: Mucous membranes are moist.   Neck: No stridor. Hematological/Lymphatic/Immunilogical: No cervical lymphadenopathy. Cardiovascular: Normal rate, regular rhythm.  No murmurs, rubs, or gallops. Respiratory: Normal respiratory effort without tachypnea nor retractions. Breath sounds are clear and equal bilaterally. No wheezes/rales/rhonchi. Gastrointestinal: Soft and nontender. No distention. There is no CVA tenderness. Genitourinary:  Deferred Musculoskeletal: Normal range of motion in all extremities. No joint effusions.  No lower extremity tenderness nor edema. Neurologic:  Normal speech and language. No gross focal neurologic deficits are appreciated.  Skin:  Skin is warm, dry and intact. No rash noted. Psychiatric: Mood and affect are normal. Speech and behavior are normal. Patient exhibits appropriate insight and judgment.  ____________________________________________    LABS (pertinent positives/negatives)  Labs Reviewed  URINALYSIS COMPLETEWITH MICROSCOPIC ([facility] ONLY) - Abnormal; Notable for the following:    Color, Urine YELLOW (*)    APPearance CLEAR (*)    Squamous Epithelial / LPF 0-5 (*)    All other components within normal limits  POC URINE PREG, ED  POCT PREGNANCY, URINE     ____________________________________________   EKG  None  ____________________________________________    RADIOLOGY  None   ____________________________________________   PROCEDURES  Procedure(s) performed: None  Critical Care performed: No  ____________________________________________   INITIAL IMPRESSION / ASSESSMENT AND PLAN / ED COURSE  Pertinent labs & imaging results that were available during my care of the patient were reviewed by me and considered in my medical decision making (see chart for details).  Patient presented to the emergency department today for her typical migraine. No focal neuro findings on exam. Do not feel patient requires any emergent neuroimaging given history of similar headaches. Patient was given IV fluids and medication and states she felt much better.  ____________________________________________   FINAL CLINICAL IMPRESSION(S) / ED DIAGNOSES  Final diagnoses:  Other type of migraine     Phineas Semen, MD 09/04/15 2250

## 2015-09-04 NOTE — ED Notes (Signed)
C/O headache x 1 month.  States has taken Imitrex 50 mg, which seems to intensify pain. And also takes Fioricet, with no relief.  States has been going to [facility] for headaches (PCP).   States migraine pain is to right side of head to neck.  Excedrin taken today a noon.

## 2015-09-04 NOTE — Discharge Instructions (Signed)
Please seek medical attention for any high fevers, chest pain, shortness of breath, change in behavior, persistent vomiting, bloody stool or any other new or concerning symptoms. ° ° °Migraine Headache °A migraine headache is an intense, throbbing pain on one or both sides of your head. A migraine can last for 30 minutes to several hours. °CAUSES  °The exact cause of a migraine headache is not always known. However, a migraine may be caused when nerves in the brain become irritated and release chemicals that cause inflammation. This causes pain. °Certain things may also trigger migraines, such as: °· Alcohol. °· Smoking. °· Stress. °· Menstruation. °· Aged cheeses. °· Foods or drinks that contain nitrates, glutamate, aspartame, or tyramine. °· Lack of sleep. °· Chocolate. °· Caffeine. °· Hunger. °· Physical exertion. °· Fatigue. °· Medicines used to treat chest pain (nitroglycerine), birth control pills, estrogen, and some blood pressure medicines. °SIGNS AND SYMPTOMS °· Pain on one or both sides of your head. °· Pulsating or throbbing pain. °· Severe pain that prevents daily activities. °· Pain that is aggravated by any physical activity. °· Nausea, vomiting, or both. °· Dizziness. °· Pain with exposure to bright lights, loud noises, or activity. °· General sensitivity to bright lights, loud noises, or smells. °Before you get a migraine, you may get warning signs that a migraine is coming (aura). An aura may include: °· Seeing flashing lights. °· Seeing bright spots, halos, or zigzag lines. °· Having tunnel vision or blurred vision. °· Having feelings of numbness or tingling. °· Having trouble talking. °· Having muscle weakness. °DIAGNOSIS  °A migraine headache is often diagnosed based on: °· Symptoms. °· Physical exam. °· A CT scan or MRI of your head. These imaging tests cannot diagnose migraines, but they can help rule out other causes of headaches. °TREATMENT °Medicines may be given for pain and nausea.  Medicines can also be given to help prevent recurrent migraines.  °HOME CARE INSTRUCTIONS °· Only take over-the-counter or prescription medicines for pain or discomfort as directed by your health care provider. The use of long-term narcotics is not recommended. °· Lie down in a dark, quiet room when you have a migraine. °· Keep a journal to find out what may trigger your migraine headaches. For example, write down: °¨ What you eat and drink. °¨ How much sleep you get. °¨ Any change to your diet or medicines. °· Limit alcohol consumption. °· Quit smoking if you smoke. °· Get 7-9 hours of sleep, or as recommended by your health care provider. °· Limit stress. °· Keep lights dim if bright lights bother you and make your migraines worse. °SEEK IMMEDIATE MEDICAL CARE IF:  °· Your migraine becomes severe. °· You have a fever. °· You have a stiff neck. °· You have vision loss. °· You have muscular weakness or loss of muscle control. °· You start losing your balance or have trouble walking. °· You feel faint or pass out. °· You have severe symptoms that are different from your first symptoms. °MAKE SURE YOU:  °· Understand these instructions. °· Will watch your condition. °· Will get help right away if you are not doing well or get worse. °  °This information is not intended to replace advice given to you by your health care provider. Make sure you discuss any questions you have with your health care provider. °  °Document Released: 07/28/2005 Document Revised: 08/18/2014 Document Reviewed: 04/04/2013 °Elsevier Interactive Patient Education ©2016 Elsevier Inc. ° °

## 2016-01-02 ENCOUNTER — Emergency Department: Payer: Medicaid Other

## 2016-01-02 ENCOUNTER — Emergency Department
Admission: EM | Admit: 2016-01-02 | Discharge: 2016-01-02 | Disposition: A | Discharge status: Home / Self Care | Payer: Medicaid Other | Attending: Emergency Medicine | Admitting: Emergency Medicine

## 2016-01-02 DIAGNOSIS — Y999 Unspecified external cause status: Secondary | ICD-10-CM | POA: Insufficient documentation

## 2016-01-02 DIAGNOSIS — J45909 Unspecified asthma, uncomplicated: Secondary | ICD-10-CM | POA: Insufficient documentation

## 2016-01-02 DIAGNOSIS — Z91040 Latex allergy status: Secondary | ICD-10-CM | POA: Insufficient documentation

## 2016-01-02 DIAGNOSIS — F17210 Nicotine dependence, cigarettes, uncomplicated: Secondary | ICD-10-CM | POA: Insufficient documentation

## 2016-01-02 DIAGNOSIS — X500XXA Overexertion from strenuous movement or load, initial encounter: Secondary | ICD-10-CM | POA: Insufficient documentation

## 2016-01-02 DIAGNOSIS — S86811A Strain of other muscle(s) and tendon(s) at lower leg level, right leg, initial encounter: Secondary | ICD-10-CM | POA: Insufficient documentation

## 2016-01-02 DIAGNOSIS — S86911A Strain of unspecified muscle(s) and tendon(s) at lower leg level, right leg, initial encounter: Secondary | ICD-10-CM

## 2016-01-02 DIAGNOSIS — Y929 Unspecified place or not applicable: Secondary | ICD-10-CM | POA: Insufficient documentation

## 2016-01-02 DIAGNOSIS — Y9389 Activity, other specified: Secondary | ICD-10-CM | POA: Insufficient documentation

## 2016-01-02 MED ORDER — HYDROCODONE-ACETAMINOPHEN 5-325 MG PO TABS: 1.0000 | ORAL_TABLET | ORAL | Status: DC | PRN

## 2016-01-02 MED ORDER — MELOXICAM 15 MG PO TABS: 15.0000 mg | ORAL_TABLET | Freq: Every day | ORAL | Status: DC

## 2016-01-02 NOTE — ED Provider Notes (Signed)
[facility] Emergency Department Provider Note  ____________________________________________  Time seen: Approximately 10:58 AM  I have reviewed the triage vital signs and the nursing notes.   HISTORY  Chief Complaint Knee Pain    HPI Samantha Mendez is a 25 y.o. female presents for evaluation of right knee pain. 1 week. Patient states that she's been doing a lot of lifting turning twisting helping her elderly grandmother. Patient states that her mother will follow floor different times and she is to help pick her up and move her to a different chair. Past medical history significant for right MCL tear. Currently sees orthopedic in [place].   Past Medical History  Diagnosis Date  . Asthma   . Migraine   . Back pain   . Sciatic leg pain     There are no active problems to display for this patient.   Past Surgical History  Procedure Laterality Date  . Tonsillectomy    . Adenoidectomy    . Myringotomy with tube placement    . Adenoidectomy      Current Outpatient Rx  Name  Route  Sig  Dispense  Refill  . HYDROcodone-acetaminophen (NORCO) 5-325 MG tablet   Oral   Take 1-2 tablets by mouth every 4 (four) hours as needed for moderate pain.   15 tablet   0   . meloxicam (MOBIC) 15 MG tablet   Oral   Take 1 tablet (15 mg total) by mouth daily.   30 tablet   0     Allergies Ceclor; Keflex; Latex; and Omnicef  No family history on file.  Social History Social History  Substance Use Topics  . Smoking status: Current Every Day Smoker    Types: Cigarettes  . Smokeless tobacco: Never Used  . Alcohol Use: Yes     Comment: twice weekly    Review of Systems Constitutional: No fever/chills Musculoskeletal: Right knee pain. Skin: Negative for rash. Neurological: Negative for headaches, focal weakness or numbness.  10-point ROS otherwise negative.  ____________________________________________   PHYSICAL EXAM:  VITAL SIGNS: ED  Triage Vitals  Enc Vitals Group     BP 01/02/16 1040 131/69 mmHg     Pulse --      Resp 01/02/16 1040 20     Temp 01/02/16 1040 97.5 F (36.4 C)     Temp Source 01/02/16 1040 Oral     SpO2 01/02/16 1040 99 %     Weight 01/02/16 1040 214 lb (97.07 kg)     Height 01/02/16 1040  (1.651 m)     Head Cir --      Peak Flow --      Pain Score --      Pain Loc --      Pain Edu? --      Excl. in GC? --     Constitutional: Alert and oriented. Well appearing and in no acute distress. Musculoskeletal: No lower extremity tenderness nor edema.  No joint effusions.Point tenderness medially no obvious effusion. Neurologic:  Normal speech and language. No gross focal neurologic deficits are appreciated. No gait instability. Skin:  Skin is warm, dry and intact. No rash noted. Psychiatric: Mood and affect are normal. Speech and behavior are normal.  ____________________________________________   LABS (all labs ordered are listed, but only abnormal results are displayed)  Labs Reviewed - No data to display ____________________________________________   RADIOLOGY  FINDINGS: Bone mineralization normal.  Joint spaces preserved.  No fracture, dislocation, or bone  destruction.  No joint effusion.  IMPRESSION: Normal exam.  ____________________________________________   PROCEDURES  Procedure(s) performed: None  Critical Care performed: No  ____________________________________________   INITIAL IMPRESSION / ASSESSMENT AND PLAN / ED COURSE  Pertinent labs & imaging results that were available during my care of the patient were reviewed by me and considered in my medical decision making (see chart for details).  Acute exacerbation of right knee strain. Rx given for meloxicam 15 mg daily. Immobilizer applied. Patient already has crutches rest and ice and elevation for the weekend. Reassurance provided Rx given for meloxicam and hydrocodone. She voices no other emergency  medical complaints at this time. NCCS[facility] reviewed. ____________________________________________   FINAL CLINICAL IMPRESSION(S) / ED DIAGNOSES  Final diagnoses:  Knee strain, right, initial encounter     This chart was dictated using voice recognition software/Dragon. Despite best efforts to proofread, errors can occur which can change the meaning. Any change was purely unintentional.   Evangeline Dakinharles M Beers, PA-C 01/02/16 1151  Arnaldo NatalPaul F Malinda, MD 01/02/16 [phone]

## 2016-01-02 NOTE — ED Notes (Signed)
Patient presents to the ED with right knee pain since last Monday after assisting her elderly grandmother.  Patient ambulatory with limp.  Patient states pain has not eased since injury and ibuprofen is not helping pain.  Patient is alert and oriented x 4.

## 2016-01-02 NOTE — Discharge Instructions (Signed)

## 2016-01-16 ENCOUNTER — Emergency Department
Admission: EM | Admit: 2016-01-16 | Discharge: 2016-01-17 | Disposition: A | Discharge status: Home / Self Care | Payer: Medicaid Other | Attending: Emergency Medicine | Admitting: Emergency Medicine

## 2016-01-16 ENCOUNTER — Encounter: Payer: Self-pay | Admitting: *Deleted

## 2016-01-16 DIAGNOSIS — F17210 Nicotine dependence, cigarettes, uncomplicated: Secondary | ICD-10-CM | POA: Insufficient documentation

## 2016-01-16 DIAGNOSIS — J45909 Unspecified asthma, uncomplicated: Secondary | ICD-10-CM | POA: Insufficient documentation

## 2016-01-16 DIAGNOSIS — G44219 Episodic tension-type headache, not intractable: Secondary | ICD-10-CM

## 2016-01-16 MED ORDER — DIPHENHYDRAMINE HCL 50 MG/ML IJ SOLN
50.0000 mg | Freq: Once | INTRAMUSCULAR | Status: AC
Start: 2016-01-16 — End: 2016-01-16
  Administered 2016-01-16: 50 mg via INTRAMUSCULAR
  Filled 2016-01-16: qty 1

## 2016-01-16 MED ORDER — PROCHLORPERAZINE EDISYLATE 5 MG/ML IJ SOLN
10.0000 mg | Freq: Once | INTRAMUSCULAR | Status: AC
  Administered 2016-01-16: 10 mg via INTRAMUSCULAR
  Filled 2016-01-16: qty 2

## 2016-01-16 MED ORDER — KETOROLAC TROMETHAMINE 30 MG/ML IJ SOLN
30.0000 mg | Freq: Once | INTRAMUSCULAR | Status: AC
  Administered 2016-01-16: 30 mg via INTRAMUSCULAR
  Filled 2016-01-16: qty 1

## 2016-01-16 NOTE — ED Notes (Signed)
Pt ambulatory to triage.  Pt reports a headache since last night.  Pt has nausea.  Hx of tension headaches.  Pt taking otc meds without relief.  Pt alert and speech clear.

## 2016-01-16 NOTE — ED Provider Notes (Signed)
[facility] Emergency Department Provider Note  ____________________________________________  Time seen: Approximately 10:58 PM  I have reviewed the triage vital signs and the nursing notes.   HISTORY  Chief Complaint Headache    HPI Samantha Mendez is a 25 y.o. female who presents to emergency department complaining of a headache. Patient states that she has a long history of headaches. Patient states that this is not the worst headache of her life but it is the worst headache in the last 6 months. Patient has been seen by her primary care and has tried multiple medications to include Compazine, Imitrex, butinol with very little controlof her headaches. She states that headaches typically respond well to migraine cocktail. Patient reports photophobia, phonophobia, mild dizziness that is normal for her headaches. Patient denies any visual changes, neck pain, chest pain, shortness of breath, nausea or vomiting. No other complaints at this time.  Patient does request that she has a pregnancy test in the emergency department.   Past Medical History  Diagnosis Date  . Asthma   . Migraine   . Back pain   . Sciatic leg pain     There are no active problems to display for this patient.   Past Surgical History  Procedure Laterality Date  . Tonsillectomy    . Adenoidectomy    . Myringotomy with tube placement    . Adenoidectomy      Current Outpatient Rx  Name  Route  Sig  Dispense  Refill  . HYDROcodone-acetaminophen (NORCO) 5-325 MG tablet   Oral   Take 1-2 tablets by mouth every 4 (four) hours as needed for moderate pain.   15 tablet   0   . ketorolac (TORADOL) 10 MG tablet   Oral   Take 1 tablet (10 mg total) by mouth every 6 (six) hours as needed.   20 tablet   0   . meloxicam (MOBIC) 15 MG tablet   Oral   Take 1 tablet (15 mg total) by mouth daily.   30 tablet   0     Allergies Ceclor; Keflex; Latex; and Omnicef  No family history  on file.  Social History Social History  Substance Use Topics  . Smoking status: Current Every Day Smoker    Types: Cigarettes  . Smokeless tobacco: Never Used  . Alcohol Use: Yes     Comment: twice weekly     Review of Systems  Constitutional: No fever/chills Eyes: No visual changes. No discharge. Mild photophobia. ENT: No upper respiratory complaints. Cardiovascular: no chest pain. Respiratory: no cough. No SOB. Gastrointestinal: No abdominal pain.  No nausea, no vomiting.   Musculoskeletal: Negative for musculoskeletal pain. Skin: Negative for rash, abrasions, lacerations, ecchymosis. Neurological: Positive for headache but denies focal weakness or numbness. 10-point ROS otherwise negative.  ____________________________________________   PHYSICAL EXAM:  VITAL SIGNS: ED Triage Vitals  Enc Vitals Group     BP 01/16/16 2125 118/64 mmHg     Pulse Rate 01/16/16 2125 77     Resp 01/16/16 2125 18     Temp 01/16/16 2125 98.2 F (36.8 C)     Temp Source 01/16/16 2125 Oral     SpO2 01/16/16 2125 99 %     Weight 01/16/16 2125 210 lb (95.255 kg)     Height 01/16/16 2125 5\' 5"  (1.651 m)     Head Cir --      Peak Flow --      Pain Score 01/16/16 2128 6  Pain Loc --      Pain Edu? --      Excl. in GC? --      Constitutional: Alert and oriented. Well appearing and in no acute distress. Eyes: Conjunctivae are normal. PERRL. EOMI. Head: Atraumatic. Neck: No stridor. Neck is supple with full range of motion  Cardiovascular: Normal rate, regular rhythm. Normal S1 and S2.  Good peripheral circulation. Respiratory: Normal respiratory effort without tachypnea or retractions. Lungs CTAB. Good air entry to the bases with no decreased or absent breath sounds. Musculoskeletal: Full range of motion to all extremities. No gross deformities appreciated. Neurologic:  Normal speech and language. No gross focal neurologic deficits are appreciated. Cranial nerves II through XII are  grossly intact. Skin:  Skin is warm, dry and intact. No rash noted. Psychiatric: Mood and affect are normal. Speech and behavior are normal. Patient exhibits appropriate insight and judgement.   ____________________________________________   LABS (all labs ordered are listed, but only abnormal results are displayed)  Labs Reviewed  POC URINE PREG, ED   ____________________________________________  EKG   ____________________________________________  RADIOLOGY   No results found.  ____________________________________________    PROCEDURES  Procedure(s) performed:       Medications  ketorolac (TORADOL) 30 MG/ML injection 30 mg (30 mg Intramuscular Given 01/16/16 2358)  prochlorperazine (COMPAZINE) injection 10 mg (10 mg Intramuscular Given 01/16/16 2359)  diphenhydrAMINE (BENADRYL) injection 50 mg (50 mg Intramuscular Given 01/16/16 2359)     ____________________________________________   INITIAL IMPRESSION / ASSESSMENT AND PLAN / ED COURSE  Pertinent labs & imaging results that were available during my care of the patient were reviewed by me and considered in my medical decision making (see chart for details).  Patient's diagnosis is consistent with tension-type headache. Patient had a negative pregnancy test here in the emergency department. As such, patient was given migraine cocktail here in the emergency department with good relief. Patient is requesting medication for rebound headache tomorrow and as such Patient will be discharged home with prescriptions for Toradol tablets. Patient is to follow up with neurologist. Patient is given ED precautions to return to the ED for any worsening or new symptoms.     ____________________________________________  FINAL CLINICAL IMPRESSION(S) / ED DIAGNOSES  Final diagnoses:  Episodic tension-type headache, not intractable      NEW MEDICATIONS STARTED DURING THIS VISIT:  New Prescriptions   KETOROLAC (TORADOL) 10  MG TABLET    Take 1 tablet (10 mg total) by mouth every 6 (six) hours as needed.        This chart was dictated using voice recognition software/Dragon. Despite best efforts to proofread, errors can occur which can change the meaning. Any change was purely unintentional.    Samantha Patches, PA-C 01/17/16 0006  Governor Rooks, MD 01/19/16 1258

## 2016-01-17 MED ORDER — KETOROLAC TROMETHAMINE 10 MG PO TABS: 10.0000 mg | ORAL_TABLET | Freq: Four times a day (QID) | ORAL | Status: DC | PRN

## 2016-01-17 NOTE — ED Notes (Signed)
E sig pad unavailable, pt verbalized understanding 

## 2016-01-17 NOTE — Discharge Instructions (Signed)
Tension Headache A tension headache is a feeling of pain, pressure, or aching that is often felt over the front and sides of the head. The pain can be dull, or it can feel tight (constricting). Tension headaches are not normally associated with nausea or vomiting, and they do not get worse with physical activity. Tension headaches can last from 30 minutes to several days. This is the most common type of headache. CAUSES The exact cause of this condition is not known. Tension headaches often begin after stress, anxiety, or depression. Other triggers may include:  Alcohol.  Too much caffeine, or caffeine withdrawal.  Respiratory infections, such as colds, flu, or sinus infections.  Dental problems or teeth clenching.  Fatigue.  Holding your head and neck in the same position for a long period of time, such as while using a computer.  Smoking. SYMPTOMS Symptoms of this condition include:  A feeling of pressure around the head.  Dull, aching head pain.  Pain felt over the front and sides of the head.  Tenderness in the muscles of the head, neck, and shoulders. DIAGNOSIS This condition may be diagnosed based on your symptoms and a physical exam. Tests may be done, such as a CT scan or an MRI of your head. These tests may be done if your symptoms are severe or unusual. TREATMENT This condition may be treated with lifestyle changes and medicines to help relieve symptoms. HOME CARE INSTRUCTIONS Managing Pain  Take over-the-counter and prescription medicines only as told by your health care provider.  Lie down in a dark, quiet room when you have a headache.  If directed, apply ice to the head and neck area:  Put ice in a plastic bag.  Place a towel between your skin and the bag.  Leave the ice on for 20 minutes, 2-3 times per day.  Use a heating pad or a hot shower to apply heat to the head and neck area as told by your health care provider. Eating and Drinking  Eat meals on  a regular schedule.  Limit alcohol use.  Decrease your caffeine intake, or stop using caffeine. General Instructions  Keep all follow-up visits as told by your health care provider. This is important.  Keep a headache journal to help find out what may trigger your headaches. For example, write down:  What you eat and drink.  How much sleep you get.  Any change to your diet or medicines.  Try massage or other relaxation techniques.  Limit stress.  Sit up straight, and avoid tensing your muscles.  Do not use tobacco products, including cigarettes, chewing tobacco, or e-cigarettes. If you need help quitting, ask your health care provider.  Exercise regularly as told by your health care provider.  Get 7-9 hours of sleep, or the amount recommended by your health care provider. SEEK MEDICAL CARE IF:  Your symptoms are not helped by medicine.  You have a headache that is different from what you normally experience.  You have nausea or you vomit.  You have a fever. SEEK IMMEDIATE MEDICAL CARE IF:  Your headache becomes severe.  You have repeated vomiting.  You have a stiff neck.  You have a loss of vision.  You have problems with speech.  You have pain in your eye or ear.  You have muscular weakness or loss of muscle control.  You lose your balance or you have trouble walking.  You feel faint or you pass out.  You have confusion.     This information is not intended to replace advice given to you by your health care provider. Make sure you discuss any questions you have with your health care provider.   Document Released: 07/28/2005 Document Revised: 04/18/2015 Document Reviewed: 11/20/2014 Elsevier Interactive Patient Education 2016 Elsevier Inc.  

## 2016-01-21 ENCOUNTER — Encounter: Payer: Self-pay | Admitting: Emergency Medicine

## 2016-01-21 ENCOUNTER — Emergency Department: Payer: Self-pay

## 2016-01-21 ENCOUNTER — Emergency Department
Admission: EM | Admit: 2016-01-21 | Discharge: 2016-01-21 | Disposition: A | Discharge status: Home / Self Care | Payer: Self-pay | Attending: Emergency Medicine | Admitting: Emergency Medicine

## 2016-01-21 DIAGNOSIS — J45909 Unspecified asthma, uncomplicated: Secondary | ICD-10-CM | POA: Insufficient documentation

## 2016-01-21 DIAGNOSIS — F17210 Nicotine dependence, cigarettes, uncomplicated: Secondary | ICD-10-CM | POA: Insufficient documentation

## 2016-01-21 DIAGNOSIS — R109 Unspecified abdominal pain: Secondary | ICD-10-CM

## 2016-01-21 DIAGNOSIS — N83201 Unspecified ovarian cyst, right side: Secondary | ICD-10-CM

## 2016-01-21 DIAGNOSIS — Z79899 Other long term (current) drug therapy: Secondary | ICD-10-CM | POA: Insufficient documentation

## 2016-01-21 DIAGNOSIS — Z91040 Latex allergy status: Secondary | ICD-10-CM | POA: Insufficient documentation

## 2016-01-21 LAB — COMPREHENSIVE METABOLIC PANEL
ALBUMIN: 4.1 g/dL (ref 3.5–5.0)
ALK PHOS: 63 U/L (ref 38–126)
ALT: 25 U/L (ref 14–54)
AST: 23 U/L (ref 15–41)
Anion gap: 6 (ref 5–15)
BUN: 16 mg/dL (ref 6–20)
CALCIUM: 8.9 mg/dL (ref 8.9–10.3)
CO2: 25 mmol/L (ref 22–32)
CREATININE: 0.79 mg/dL (ref 0.44–1.00)
Chloride: 106 mmol/L (ref 101–111)
GFR calc Af Amer: >60 mL/min (ref >60)
GFR calc non Af Amer: >60 mL/min (ref >60)
GLUCOSE: 90 mg/dL (ref 65–99)
Potassium: 4.2 mmol/L (ref 3.5–5.1)
Sodium: 137 mmol/L (ref 135–145)
Total Bilirubin: 0.4 mg/dL (ref 0.3–1.2)
Total Protein: 6.6 g/dL (ref 6.5–8.1)

## 2016-01-21 LAB — URINALYSIS COMPLETE WITH MICROSCOPIC (ARMC ONLY)
BILIRUBIN URINE: NEGATIVE
GLUCOSE, UA: NEGATIVE mg/dL
Hgb urine dipstick: NEGATIVE
KETONES UR: NEGATIVE mg/dL
Nitrite: NEGATIVE
PROTEIN: NEGATIVE mg/dL
Specific Gravity, Urine: 1.018 (ref 1.005–1.030)
pH: 5 (ref 5.0–8.0)

## 2016-01-21 LAB — LIPASE, BLOOD: Lipase: 28 U/L (ref 11–51)

## 2016-01-21 LAB — CBC
HCT: 40 % (ref 35.0–47.0)
Hemoglobin: 13.7 g/dL (ref 12.0–16.0)
MCH: 32.2 pg (ref 26.0–34.0)
MCHC: 34.4 g/dL (ref 32.0–36.0)
MCV: 93.7 fL (ref 80.0–100.0)
PLATELETS: 176 10*3/uL (ref 150–440)
RBC: 4.27 MIL/uL (ref 3.80–5.20)
RDW: 14 % (ref 11.5–14.5)
WBC: 15.2 10*3/uL — ABNORMAL HIGH (ref 3.6–11.0)

## 2016-01-21 LAB — PREGNANCY, URINE: Preg Test, Ur: NEGATIVE

## 2016-01-21 MED ORDER — IOPAMIDOL (ISOVUE-300) INJECTION 61%
125.0000 mL | Freq: Once | INTRAVENOUS | Status: AC | PRN
  Administered 2016-01-21: 125 mL via INTRAVENOUS

## 2016-01-21 MED ORDER — DIATRIZOATE MEGLUMINE & SODIUM 66-10 % PO SOLN
15.0000 mL | Freq: Once | ORAL | Status: AC
  Administered 2016-01-21: 15 mL via ORAL

## 2016-01-21 MED ORDER — KETOROLAC TROMETHAMINE 30 MG/ML IJ SOLN
15.0000 mg | INTRAMUSCULAR | Status: AC
  Administered 2016-01-21: 15 mg via INTRAVENOUS

## 2016-01-21 MED ORDER — MORPHINE SULFATE (PF) 4 MG/ML IV SOLN
4.0000 mg | Freq: Once | INTRAVENOUS | Status: AC
  Administered 2016-01-21: 4 mg via INTRAVENOUS
  Filled 2016-01-21: qty 1

## 2016-01-21 MED ORDER — TRAMADOL HCL 50 MG PO TABS: 50.0000 mg | ORAL_TABLET | Freq: Four times a day (QID) | ORAL | Status: DC | PRN

## 2016-01-21 MED ORDER — ONDANSETRON HCL 4 MG/2ML IJ SOLN
4.0000 mg | Freq: Once | INTRAMUSCULAR | Status: AC
  Administered 2016-01-21: 4 mg via INTRAVENOUS
  Filled 2016-01-21: qty 2

## 2016-01-21 MED ORDER — KETOROLAC TROMETHAMINE 30 MG/ML IJ SOLN
INTRAMUSCULAR | Status: AC
  Administered 2016-01-21: 15 mg via INTRAVENOUS
  Filled 2016-01-21: qty 1

## 2016-01-21 NOTE — ED Provider Notes (Signed)
[facility] Emergency Department Provider Note  ____________________________________________  Time seen: ~0520  I have reviewed the triage vital signs and the nursing notes.   HISTORY  Chief Complaint Abdominal Pain   History limited by: Not Limited   HPI Samantha Mendez is a 25 y.o. female who presents to the emergency department today because of concerns for abdominal pain. The patient states that the pain started yesterday. It started in the right lower quadrant. It is now still in the right lower quadrant although radiating to the left lower quadrant. It is severe. She denies any nausea or vomiting. She has had a poor appetite for the past 2 weeks. She denies any change in defecation. No dysuria or bad odor to her urine. She has not appreciated any fevers. States she had somewhat similar pain once in the past when she had an ovarian cyst. This does not feel exactly like that pain however.   Past Medical History  Diagnosis Date  . Asthma   . Migraine   . Back pain   . Sciatic leg pain     There are no active problems to display for this patient.   Past Surgical History  Procedure Laterality Date  . Tonsillectomy    . Adenoidectomy    . Myringotomy with tube placement    . Adenoidectomy      Current Outpatient Rx  Name  Route  Sig  Dispense  Refill  . HYDROcodone-acetaminophen (NORCO) 5-325 MG tablet   Oral   Take 1-2 tablets by mouth every 4 (four) hours as needed for moderate pain.   15 tablet   0   . ketorolac (TORADOL) 10 MG tablet   Oral   Take 1 tablet (10 mg total) by mouth every 6 (six) hours as needed.   20 tablet   0   . meloxicam (MOBIC) 15 MG tablet   Oral   Take 1 tablet (15 mg total) by mouth daily.   30 tablet   0     Allergies Ceclor; Keflex; Latex; and Omnicef  No family history on file.  Social History Social History  Substance Use Topics  . Smoking status: Current Every Day Smoker    Types: Cigarettes   . Smokeless tobacco: Never Used  . Alcohol Use: Yes     Comment: twice weekly    Review of Systems  Constitutional: Negative for fever. Cardiovascular: Negative for chest pain. Respiratory: Negative for shortness of breath. Gastrointestinal: Positive for abdominal pain Genitourinary: Negative for dysuria. Musculoskeletal: Negative for back pain. Skin: Negative for rash. Neurological: Negative for headaches, focal weakness or numbness.  10-point ROS otherwise negative.  ____________________________________________   PHYSICAL EXAM:  VITAL SIGNS: ED Triage Vitals  Enc Vitals Group     BP 01/21/16 0406 120/61 mmHg     Pulse Rate 01/21/16 0406 108     Resp 01/21/16 0406 18     Temp 01/21/16 0406 97.9 F (36.6 C)     Temp Source 01/21/16 0406 Oral     SpO2 01/21/16 0406 97 %     Weight 01/21/16 0406 215 lb (97.523 kg)     Height 01/21/16 0406 5\' 5"  (1.651 m)     Head Cir --      Peak Flow --      Pain Score 01/21/16 0407 7   Constitutional: Alert and oriented. Well appearing and in no distress. Eyes: Conjunctivae are normal. PERRL. Normal extraocular movements. ENT   Head: Normocephalic and atraumatic.  Nose: No congestion/rhinnorhea.   Mouth/Throat: Mucous membranes are moist.   Neck: No stridor. Hematological/Lymphatic/Immunilogical: No cervical lymphadenopathy. Cardiovascular: Normal rate, regular rhythm.  No murmurs, rubs, or gallops. Respiratory: Normal respiratory effort without tachypnea nor retractions. Breath sounds are clear and equal bilaterally. No wheezes/rales/rhonchi. Gastrointestinal: Soft and Tender to palpation in the right lower quadrant. Positives Rovsing sign Genitourinary: Deferred Musculoskeletal: Normal range of motion in all extremities. No joint effusions.  No lower extremity tenderness nor edema. Neurologic:  Normal speech and language. No gross focal neurologic deficits are appreciated.  Skin:  Skin is warm, dry and intact. No  rash noted. Psychiatric: Mood and affect are normal. Speech and behavior are normal. Patient exhibits appropriate insight and judgment.  ____________________________________________    LABS (pertinent positives/negatives)  Labs Reviewed  CBC - Abnormal; Notable for the following:    WBC 15.2 (*)    All other components within normal limits  URINALYSIS COMPLETEWITH MICROSCOPIC ([facility] ONLY) - Abnormal; Notable for the following:    Color, Urine YELLOW (*)    APPearance CLOUDY (*)    Leukocytes, UA 3+ (*)    Bacteria, UA RARE (*)    Squamous Epithelial / LPF 6-30 (*)    All other components within normal limits  LIPASE, BLOOD  COMPREHENSIVE METABOLIC PANEL  PREGNANCY, URINE     ____________________________________________   EKG  None  ____________________________________________    RADIOLOGY  CT abd.pel IMPRESSION: No acute process demonstrated in the abdomen or pelvis. Appendix is normal. Small amount of free fluid in the pelvis is likely physiologic.    ____________________________________________   PROCEDURES  Procedure(s) performed: None  Critical Care performed: No  ____________________________________________   INITIAL IMPRESSION / ASSESSMENT AND PLAN / ED COURSE  Pertinent labs & imaging results that were available during my care of the patient were reviewed by me and considered in my medical decision making (see chart for details).  Patient presents to the emergency department tonight for merely with complaint of right lower quadrant pain. On exam she does have tenderness in the right lower quadrant with positive Rovsing sign. Patient is a leukocytosis of 15,000. Will get a CT abdomen and pelvis to evaluate for appendicitis.   ----------------------------------------- 7:33 AM on 01/21/2016 -----------------------------------------  CT scan of pelvis without any concerning findings. There was a small amount of free fluid in the pelvis. Because  of this an ultrasound was ordered. Furthermore I did discuss with the patient pelvic exam. I did discuss that that would allow [place] to evaluate for concerning infection. At this time however patient did defer pelvic exam. I did tell you that was my recommendation. She states she will follow up with her own OB/GYN doctor for her pelvic exam.  ____________________________________________   FINAL CLINICAL IMPRESSION(S) / ED DIAGNOSES  Abdominal pain  Note: This dictation was prepared with Dragon dictation. Any transcriptional errors that result from this process are unintentional    Phineas Semen, MD 01/21/16 [phone]

## 2016-01-21 NOTE — ED Provider Notes (Signed)
Ultrasound reveals 3 cm right ovarian cyst, complex. Small amount of pelvic free fluid. Vital signs stable, patient not in distress. Low suspicion for torsion. No evidence of torsion on ultrasound. Prescribed tramadol by Dr. Derrill KayGoodman. Patient counseled on findings, follow-up closely with primary care. Usual return precautions given. Final diagnoses:  Abdominal pain  Right ovarian cyst     Sharman CheekPhillip Bao Bazen, MD 01/21/16 [phone]

## 2016-01-21 NOTE — Discharge Instructions (Signed)
Your ultrasound shows a 3cm complex cyst on the right ovary.  Please follow up with your primary care doctor for continued monitoring of this finding.  The summary of the ultrasound findings is: IMPRESSION: 1. 3.5 x 2.5 x 3.0 cm complex cyst right ovary. Most likely hemorrhagic or corpus luteal cyst. Short-interval follow up ultrasound in 6-12 weeks is recommended, preferably during the week following the patient's normal menses.  2. Small amount of free pelvic fluid. No other abnormality identified. No evidence of torsion .  Abdominal Pain, Adult Many things can cause abdominal pain. Usually, abdominal pain is not caused by a disease and will improve without treatment. It can often be observed and treated at home. Your health care provider will do a physical exam and possibly order blood tests and X-rays to help determine the seriousness of your pain. However, in many cases, more time must pass before a clear cause of the pain can be found. Before that point, your health care provider may not know if you need more testing or further treatment. HOME CARE INSTRUCTIONS Monitor your abdominal pain for any changes. The following actions may help to alleviate any discomfort you are experiencing:  Only take over-the-counter or prescription medicines as directed by your health care provider.  Do not take laxatives unless directed to do so by your health care provider.  Try a clear liquid diet (broth, tea, or water) as directed by your health care provider. Slowly move to a bland diet as tolerated. SEEK MEDICAL CARE IF:  You have unexplained abdominal pain.  You have abdominal pain associated with nausea or diarrhea.  You have pain when you urinate or have a bowel movement.  You experience abdominal pain that wakes you in the night.  You have abdominal pain that is worsened or improved by eating food.  You have abdominal pain that is worsened with eating fatty foods.  You have a  fever. SEEK IMMEDIATE MEDICAL CARE IF:  Your pain does not go away within 2 hours.  You keep throwing up (vomiting).  Your pain is felt only in portions of the abdomen, such as the right side or the left lower portion of the abdomen.  You pass bloody or black tarry stools. MAKE SURE YOU:  Understand these instructions.  Will watch your condition.  Will get help right away if you are not doing well or get worse.   This information is not intended to replace advice given to you by your health care provider. Make sure you discuss any questions you have with your health care provider.   Document Released: 05/07/2005 Document Revised: 04/18/2015 Document Reviewed: 04/06/2013 Elsevier Interactive Patient Education [employer]2016 Elsevier Inc.

## 2016-01-21 NOTE — ED Notes (Signed)
Patient reports lower abd pain that started yesterday around 14:00. Denies nausea, vomiting, diarrhea or urinary symptoms.

## 2016-01-21 NOTE — ED Notes (Signed)
Report to tricia, rn.  

## 2016-01-23 LAB — POCT PREGNANCY, URINE: PREG TEST UR: NEGATIVE

## 2016-05-15 ENCOUNTER — Encounter: Payer: Self-pay | Admitting: Emergency Medicine

## 2016-05-15 ENCOUNTER — Emergency Department
Admission: EM | Admit: 2016-05-15 | Discharge: 2016-05-16 | Disposition: A | Discharge status: Home / Self Care | Payer: Medicaid Other | Attending: Emergency Medicine | Admitting: Emergency Medicine

## 2016-05-15 DIAGNOSIS — J45909 Unspecified asthma, uncomplicated: Secondary | ICD-10-CM | POA: Insufficient documentation

## 2016-05-15 DIAGNOSIS — F17210 Nicotine dependence, cigarettes, uncomplicated: Secondary | ICD-10-CM | POA: Insufficient documentation

## 2016-05-15 DIAGNOSIS — R519 Headache, unspecified: Secondary | ICD-10-CM

## 2016-05-15 DIAGNOSIS — Z79899 Other long term (current) drug therapy: Secondary | ICD-10-CM | POA: Insufficient documentation

## 2016-05-15 DIAGNOSIS — R51 Headache: Secondary | ICD-10-CM | POA: Insufficient documentation

## 2016-05-15 MED ORDER — MAGNESIUM SULFATE 2 GM/50ML IV SOLN
2.0000 g | Freq: Once | INTRAVENOUS | Status: AC
  Administered 2016-05-15: 2 g via INTRAVENOUS
  Filled 2016-05-15 (×2): qty 50

## 2016-05-15 MED ORDER — DEXAMETHASONE SODIUM PHOSPHATE 10 MG/ML IJ SOLN
10.0000 mg | Freq: Once | INTRAMUSCULAR | Status: AC
  Administered 2016-05-15: 10 mg via INTRAVENOUS

## 2016-05-15 MED ORDER — PROCHLORPERAZINE EDISYLATE 5 MG/ML IJ SOLN
INTRAMUSCULAR | Status: AC
  Administered 2016-05-15: 10 mg via INTRAVENOUS
  Filled 2016-05-15: qty 2

## 2016-05-15 MED ORDER — SODIUM CHLORIDE 0.9 % IV BOLUS (SEPSIS)
1000.0000 mL | Freq: Once | INTRAVENOUS | Status: AC
  Administered 2016-05-15: 1000 mL via INTRAVENOUS

## 2016-05-15 MED ORDER — PROCHLORPERAZINE EDISYLATE 5 MG/ML IJ SOLN
10.0000 mg | Freq: Once | INTRAMUSCULAR | Status: AC
  Administered 2016-05-15: 10 mg via INTRAVENOUS

## 2016-05-15 MED ORDER — KETOROLAC TROMETHAMINE 30 MG/ML IJ SOLN
30.0000 mg | Freq: Once | INTRAMUSCULAR | Status: AC
  Administered 2016-05-15: 30 mg via INTRAVENOUS
  Filled 2016-05-15: qty 1

## 2016-05-15 MED ORDER — DEXAMETHASONE SODIUM PHOSPHATE 10 MG/ML IJ SOLN
INTRAMUSCULAR | Status: AC
  Administered 2016-05-15: 10 mg via INTRAVENOUS
  Filled 2016-05-15: qty 1

## 2016-05-15 NOTE — ED Notes (Signed)
EDP at bedside at this time.  

## 2016-05-15 NOTE — ED Provider Notes (Addendum)
[facility] Emergency Department Provider Note   ____________________________________________   First MD Initiated Contact with Patient 05/15/16 2200     (approximate)  I have reviewed the triage vital signs and the nursing notes.   HISTORY  Chief Complaint Headache   HPI Samantha Mendez is a 25 y.o. female with a history of migraine headaches was presenting with any other 10 headache which she says starts in the center forehead and radiates to the back of her head. Says that it is like a pressure type pain and is an 8 out of 10 at this time. It is been slowly increasing over the past week. Says that she has tried multiple over-the-counter medications including aspirin, Excedrin and ibuprofen. She says that light and sound bother her. Denies any nausea. Says that she has a slight feeling of disorientation as well. She says that she has tried multiple medications in the past including Compazine without any relief.   Past Medical History:  Diagnosis Date  . Asthma   . Back pain   . Migraine   . Sciatic leg pain     There are no active problems to display for this patient.   Past Surgical History:  Procedure Laterality Date  . ADENOIDECTOMY    . ADENOIDECTOMY    . MYRINGOTOMY WITH TUBE PLACEMENT    . TONSILLECTOMY      Prior to Admission medications   Medication Sig Start Date End Date Taking? Authorizing Provider  albuterol (PROVENTIL HFA;VENTOLIN HFA) 108 (90 Base) MCG/ACT inhaler Inhale 2 puffs into the lungs every 6 (six) hours as needed for wheezing.     Historical Provider, MD  Doxylamine-DM (VICKS DAYQUIL/NYQUIL COUGH PO) Take 2 capsules by mouth every 6 (six) hours as needed (for cough).    Historical Provider, MD  HYDROcodone-acetaminophen (NORCO) 5-325 MG tablet Take 1-2 tablets by mouth every 4 (four) hours as needed for moderate pain. Patient not taking: Reported on 01/21/2016 01/02/16   Charmayne Sheer Beers, PA-C  ketorolac (TORADOL) 10 MG  tablet Take 1 tablet (10 mg total) by mouth every 6 (six) hours as needed. Patient not taking: Reported on 01/21/2016 01/17/16   Delorise Royals Cuthriell, PA-C  meloxicam (MOBIC) 15 MG tablet Take 1 tablet (15 mg total) by mouth daily. Patient not taking: Reported on 01/21/2016 01/02/16   Charmayne Sheer Beers, PA-C  traMADol (ULTRAM) 50 MG tablet Take 1 tablet (50 mg total) by mouth every 6 (six) hours as needed. 01/21/16 01/20/17  Phineas Semen, MD    Allergies Ceclor [cefaclor]; Keflex [cephalexin]; Latex; and Omnicef [cefdinir]  History reviewed. No pertinent family history.  Social History Social History  Substance Use Topics  . Smoking status: Current Every Day Smoker    Types: Cigarettes  . Smokeless tobacco: Never Used  . Alcohol use Yes     Comment: twice weekly    Review of Systems Constitutional: No fever/chills Eyes: No visual changes. ENT: No sore throat. Cardiovascular: Denies chest pain. Respiratory: Denies shortness of breath. Gastrointestinal: No abdominal pain.  No nausea, no vomiting.  No diarrhea.  No constipation. Genitourinary: Negative for dysuria. Musculoskeletal: Negative for back pain. Skin: Negative for rash. Neurological: Negative for focal weakness or numbness.  10-point ROS otherwise negative.  ____________________________________________   PHYSICAL EXAM:  VITAL SIGNS: ED Triage Vitals  Enc Vitals Group     BP 05/15/16 2044 117/73     Pulse Rate 05/15/16 2044 62     Resp 05/15/16 2044 15  Temp 05/15/16 2044 98.1 F (36.7 C)     Temp Source 05/15/16 2044 Oral     SpO2 05/15/16 2044 100 %     Weight 05/15/16 2045 217 lb (98.4 kg)     Height 05/15/16 2045 5\' 5"  (1.651 m)     Head Circumference --      Peak Flow --      Pain Score 05/15/16 2049 8     Pain Loc --      Pain Edu? --      Excl. in GC? --     Constitutional: Alert and oriented. Well appearing and in no acute distress. Eyes: Conjunctivae are normal. PERRL. EOMI. Head:  Atraumatic. Nose: No congestion/rhinnorhea. Mouth/Throat: Mucous membranes are moist.   Neck: No stridor.   Cardiovascular: Normal rate, regular rhythm. Grossly normal heart sounds.   Respiratory: Normal respiratory effort.  No retractions. Lungs CTAB. Gastrointestinal: Soft and nontender. No distention.  Musculoskeletal: No lower extremity tenderness nor edema.  No joint effusions. Neurologic:  Normal speech and language. No gross focal neurologic deficits are appreciated. No gait instability. Skin:  Skin is warm, dry and intact. No rash noted. Psychiatric: Mood and affect are normal. Speech and behavior are normal.  ____________________________________________   LABS (all labs ordered are listed, but only abnormal results are displayed)  Labs Reviewed - No data to display ____________________________________________  EKG   ____________________________________________  RADIOLOGY   ____________________________________________   PROCEDURES  Procedure(s) performed:   Procedures  Critical Care performed:   ____________________________________________   INITIAL IMPRESSION / ASSESSMENT AND PLAN / ED COURSE  Pertinent labs & imaging results that were available during my care of the patient were reviewed by me and considered in my medical decision making (see chart for details).  ----------------------------------------- 11:18 PM on 05/15/2016 -----------------------------------------  Patient receiving IV meds at this time for headache. Signed out to Dr. Dolores FrameSung.  Clinical Course     ____________________________________________   FINAL CLINICAL IMPRESSION(S) / ED DIAGNOSES  Headache.    NEW MEDICATIONS STARTED DURING THIS VISIT:  New Prescriptions   No medications on file     Note:  This document was prepared using Dragon voice recognition software and may include unintentional dictation errors.    Myrna Blazeravid Matthew Schaevitz, MD 05/15/16 [id number]     Myrna Blazeravid Matthew Schaevitz, MD 05/15/16 [id number]    Myrna Blazeravid Matthew Schaevitz, MD 05/15/16 [phone]

## 2016-05-15 NOTE — ED Notes (Signed)
Sherilyn CooterHenry RN presented to the Pt's case to Dr. Alphonzo LemmingsMcShane and he recommended not to do any protocols.

## 2016-05-15 NOTE — ED Triage Notes (Signed)
Pt arrived to the ED accompanied by her husband for complaints of headache x 10 days. Pt reports that this is a chronic problem that was resolved and came back. Pt states that over the counter medication no longer helps. Pt denies any other accompanied symptoms. Pt is AOx4 in no apparent distress.

## 2016-05-16 MED ORDER — PROMETHAZINE HCL 25 MG PO TABS: 25.0000 mg | ORAL_TABLET | Freq: Four times a day (QID) | ORAL | 0 refills | Status: DC | PRN

## 2016-05-16 MED ORDER — BUTALBITAL-APAP-CAFFEINE 50-325-40 MG PO TABS: 1.0000 | ORAL_TABLET | ORAL | 0 refills | Status: DC | PRN

## 2016-05-16 NOTE — Discharge Instructions (Signed)
1. You may take medicines as needed for headache and nausea (Fioricet/Phenergan #20). 2. Drink plenty of fluids daily. 3. Return to the ER for worsening symptoms, persistent vomiting, difficulty breathing or other concerns.

## 2016-05-16 NOTE — ED Provider Notes (Signed)
-----------------------------------------   12:04 AM on 05/16/2016 -----------------------------------------  Patient is feeling much better after IV headache cocktail. Pain is currently 2/10. No focal neurological deficits on my exam. Will prescribe Fioricet and Phenergan for home use and encourage patient to follow-up with her PCP next week. Strict return precautions given. Patient verbalizes understanding and agrees with plan of care.   Irean HongJade J Sung, MD 05/16/16 0005

## 2016-07-29 ENCOUNTER — Encounter: Payer: Self-pay | Admitting: Emergency Medicine

## 2016-07-29 ENCOUNTER — Emergency Department: Payer: Self-pay

## 2016-07-29 ENCOUNTER — Emergency Department
Admission: EM | Admit: 2016-07-29 | Discharge: 2016-07-30 | Disposition: A | Discharge status: Home / Self Care | Payer: Self-pay | Attending: Emergency Medicine | Admitting: Emergency Medicine

## 2016-07-29 DIAGNOSIS — F17210 Nicotine dependence, cigarettes, uncomplicated: Secondary | ICD-10-CM | POA: Insufficient documentation

## 2016-07-29 DIAGNOSIS — Z79899 Other long term (current) drug therapy: Secondary | ICD-10-CM | POA: Insufficient documentation

## 2016-07-29 DIAGNOSIS — J45909 Unspecified asthma, uncomplicated: Secondary | ICD-10-CM | POA: Insufficient documentation

## 2016-07-29 DIAGNOSIS — N760 Acute vaginitis: Secondary | ICD-10-CM | POA: Insufficient documentation

## 2016-07-29 DIAGNOSIS — N83201 Unspecified ovarian cyst, right side: Secondary | ICD-10-CM | POA: Insufficient documentation

## 2016-07-29 DIAGNOSIS — R102 Pelvic and perineal pain: Secondary | ICD-10-CM

## 2016-07-29 DIAGNOSIS — B9689 Other specified bacterial agents as the cause of diseases classified elsewhere: Secondary | ICD-10-CM

## 2016-07-29 DIAGNOSIS — Z91040 Latex allergy status: Secondary | ICD-10-CM | POA: Insufficient documentation

## 2016-07-29 LAB — WET PREP, GENITAL
SPERM: NONE SEEN
Trich, Wet Prep: NONE SEEN
WBC WET PREP: NONE SEEN
YEAST WET PREP: NONE SEEN

## 2016-07-29 LAB — CBC WITH DIFFERENTIAL/PLATELET
BASOS ABS: 0 10*3/uL (ref 0–0.1)
BASOS PCT: 0 %
EOS ABS: 0.1 10*3/uL (ref 0–0.7)
EOS PCT: 1 %
HCT: 39.3 % (ref 35.0–47.0)
Hemoglobin: 13.7 g/dL (ref 12.0–16.0)
Lymphocytes Relative: 30 %
Lymphs Abs: 3.5 10*3/uL (ref 1.0–3.6)
MCH: 32.1 pg (ref 26.0–34.0)
MCHC: 34.9 g/dL (ref 32.0–36.0)
MCV: 91.9 fL (ref 80.0–100.0)
MONO ABS: 0.9 10*3/uL (ref 0.2–0.9)
MONOS PCT: 7 %
NEUTROS ABS: 7.2 10*3/uL — AB (ref 1.4–6.5)
Neutrophils Relative %: 62 %
PLATELETS: 218 10*3/uL (ref 150–440)
RBC: 4.28 MIL/uL (ref 3.80–5.20)
RDW: 13.1 % (ref 11.5–14.5)
WBC: 11.7 10*3/uL — ABNORMAL HIGH (ref 3.6–11.0)

## 2016-07-29 LAB — RAPID HIV SCREEN (HIV 1/2 AB+AG)
HIV 1/2 Antibodies: NONREACTIVE
HIV-1 P24 ANTIGEN - HIV24: NONREACTIVE

## 2016-07-29 LAB — COMPREHENSIVE METABOLIC PANEL
ALK PHOS: 51 U/L (ref 38–126)
ALT: 23 U/L (ref 14–54)
AST: 26 U/L (ref 15–41)
Albumin: 4.1 g/dL (ref 3.5–5.0)
Anion gap: 6 (ref 5–15)
BILIRUBIN TOTAL: 0.2 mg/dL — AB (ref 0.3–1.2)
BUN: 11 mg/dL (ref 6–20)
CALCIUM: 8.8 mg/dL — AB (ref 8.9–10.3)
CO2: 25 mmol/L (ref 22–32)
CREATININE: 0.66 mg/dL (ref 0.44–1.00)
Chloride: 106 mmol/L (ref 101–111)
GFR calc Af Amer: >60 mL/min (ref >60)
Glucose, Bld: 83 mg/dL (ref 65–99)
POTASSIUM: 3.7 mmol/L (ref 3.5–5.1)
Sodium: 137 mmol/L (ref 135–145)
TOTAL PROTEIN: 7.2 g/dL (ref 6.5–8.1)

## 2016-07-29 LAB — URINALYSIS, ROUTINE W REFLEX MICROSCOPIC
BILIRUBIN URINE: NEGATIVE
Bacteria, UA: NONE SEEN
GLUCOSE, UA: NEGATIVE mg/dL
Hgb urine dipstick: NEGATIVE
KETONES UR: NEGATIVE mg/dL
NITRITE: NEGATIVE
PH: 7 (ref 5.0–8.0)
Protein, ur: NEGATIVE mg/dL
SPECIFIC GRAVITY, URINE: 1.016 (ref 1.005–1.030)

## 2016-07-29 LAB — PREGNANCY, URINE: Preg Test, Ur: NEGATIVE

## 2016-07-29 LAB — LIPASE, BLOOD: LIPASE: 25 U/L (ref 11–51)

## 2016-07-29 MED ORDER — KETOROLAC TROMETHAMINE 60 MG/2ML IM SOLN
60.0000 mg | Freq: Once | INTRAMUSCULAR | Status: AC
  Administered 2016-07-29: 60 mg via INTRAMUSCULAR
  Filled 2016-07-29: qty 2

## 2016-07-29 MED ORDER — METRONIDAZOLE 500 MG PO TABS: 500.0000 mg | ORAL_TABLET | Freq: Two times a day (BID) | ORAL | 0 refills | Status: AC

## 2016-07-29 MED ORDER — TRAMADOL HCL 50 MG PO TABS: 50.0000 mg | ORAL_TABLET | Freq: Four times a day (QID) | ORAL | 0 refills | Status: DC | PRN

## 2016-07-29 NOTE — ED Provider Notes (Signed)
[facility] Emergency Department Provider Note   ____________________________________________   I have reviewed the triage vital signs and the nursing notes.   HISTORY  Chief Complaint Pelvic Pain   History limited by: Not Limited   HPI Samantha Mendez Seat is a 25 y.o. female who presents to the emergency department today because of concern for right pelvic pain. The patient states that the pain started 2 days ago. It has been somewhat constant since then. Is located in the right pelvis. It will become severe. It will be sharp. Patient states she has similar pain in the past when she had an ovarian cyst. She denies any vaginal bleeding or abnormal bleeding. No change in defecation or urination. Patient denies any fevers.   Past Medical History:  Diagnosis Date  . Asthma   . Back pain   . Migraine   . Sciatic leg pain     There are no active problems to display for this patient.   Past Surgical History:  Procedure Laterality Date  . ADENOIDECTOMY    . ADENOIDECTOMY    . MYRINGOTOMY WITH TUBE PLACEMENT    . TONSILLECTOMY      Prior to Admission medications   Medication Sig Start Date End Date Taking? Authorizing Provider  albuterol (PROVENTIL HFA;VENTOLIN HFA) 108 (90 Base) MCG/ACT inhaler Inhale 2 puffs into the lungs every 6 (six) hours as needed for wheezing.     Historical Provider, MD  butalbital-acetaminophen-caffeine (FIORICET, ESGIC) 50-325-40 MG tablet Take 1 tablet by mouth every 4 (four) hours as needed for headache. 05/16/16   Irean HongJade J Sung, MD  Doxylamine-DM (VICKS DAYQUIL/NYQUIL COUGH PO) Take 2 capsules by mouth every 6 (six) hours as needed (for cough).    Historical Provider, MD  HYDROcodone-acetaminophen (NORCO) 5-325 MG tablet Take 1-2 tablets by mouth every 4 (four) hours as needed for moderate pain. Patient not taking: Reported on 01/21/2016 01/02/16   Charmayne Sheerharles M Beers, PA-C  ketorolac (TORADOL) 10 MG tablet Take 1 tablet (10 mg total) by  mouth every 6 (six) hours as needed. Patient not taking: Reported on 01/21/2016 01/17/16   Delorise RoyalsJonathan D Cuthriell, PA-C  meloxicam (MOBIC) 15 MG tablet Take 1 tablet (15 mg total) by mouth daily. Patient not taking: Reported on 01/21/2016 01/02/16   Charmayne Sheerharles M Beers, PA-C  metroNIDAZOLE (FLAGYL) 500 MG tablet Take 1 tablet (500 mg total) by mouth 2 (two) times daily. 07/29/16 08/12/16  Phineas SemenGraydon Nikka Hakimian, MD  promethazine (PHENERGAN) 25 MG tablet Take 1 tablet (25 mg total) by mouth every 6 (six) hours as needed for nausea or vomiting. 05/16/16   Irean HongJade J Sung, MD  traMADol (ULTRAM) 50 MG tablet Take 1 tablet (50 mg total) by mouth every 6 (six) hours as needed. 01/21/16 01/20/17  Phineas SemenGraydon Logon Uttech, MD    Allergies Ceclor [cefaclor]; Keflex [cephalexin]; Latex; and Omnicef [cefdinir]  History reviewed. No pertinent family history.  Social History Social History  Substance Use Topics  . Smoking status: Current Every Day Smoker    Types: Cigarettes  . Smokeless tobacco: Never Used  . Alcohol use Yes     Comment: twice weekly    Review of Systems  Constitutional: Negative for fever. Cardiovascular: Negative for chest pain. Respiratory: Negative for shortness of breath. Gastrointestinal: Positive for right pelvic pain. Neurological: Negative for headaches, focal weakness or numbness.  10-point ROS otherwise negative.  ____________________________________________   PHYSICAL EXAM:  VITAL SIGNS: ED Triage Vitals  Enc Vitals Group     BP 07/29/16 2114  123/74     Pulse Rate 07/29/16 2114 78     Resp 07/29/16 2114 17     Temp 07/29/16 2114 98.1 F (36.7 C)     Temp Source 07/29/16 2114 Oral     SpO2 07/29/16 2114 98 %     Weight 07/29/16 2114 228 lb (103.4 kg)     Height 07/29/16 2114 5\' 5"  (1.651 m)     Head Circumference --      Peak Flow --      Pain Score 07/29/16 2122 7   Constitutional: Alert and oriented. Well appearing and in no distress. Eyes: Conjunctivae are normal. Normal  extraocular movements. ENT   Head: Normocephalic and atraumatic.   Nose: No congestion/rhinnorhea.   Mouth/Throat: Mucous membranes are moist.   Neck: No stridor. Hematological/Lymphatic/Immunilogical: No cervical lymphadenopathy. Cardiovascular: Normal rate, regular rhythm.  No murmurs, rubs, or gallops.  Respiratory: Normal respiratory effort without tachypnea nor retractions. Breath sounds are clear and equal bilaterally. No wheezes/rales/rhonchi. Gastrointestinal: Soft and non tender. No rebound. No guarding.  Pelvic: No external lesions. No blood in vaginal vault. No CMT. No adnexal fullness. Mild right adnexal tenderness.  Musculoskeletal: Normal range of motion in all extremities. No lower extremity edema. Neurologic:  Normal speech and language. No gross focal neurologic deficits are appreciated.  Skin:  Skin is warm, dry and intact. No rash noted. Psychiatric: Mood and affect are normal. Speech and behavior are normal. Patient exhibits appropriate insight and judgment.  ____________________________________________    LABS (pertinent positives/negatives)  Labs Reviewed  WET PREP, GENITAL - Abnormal; Notable for the following:       Result Value   Clue Cells Wet Prep HPF POC PRESENT (*)    All other components within normal limits  COMPREHENSIVE METABOLIC PANEL - Abnormal; Notable for the following:    Calcium 8.8 (*)    Total Bilirubin 0.2 (*)    All other components within normal limits  CBC WITH DIFFERENTIAL/PLATELET - Abnormal; Notable for the following:    WBC 11.7 (*)    Neutro Abs 7.2 (*)    All other components within normal limits  URINALYSIS, ROUTINE W REFLEX MICROSCOPIC - Abnormal; Notable for the following:    Color, Urine YELLOW (*)    APPearance HAZY (*)    Leukocytes, UA SMALL (*)    Squamous Epithelial / LPF 0-5 (*)    All other components within normal limits  CHLAMYDIA/NGC RT PCR ([facility] ONLY)  LIPASE, BLOOD  PREGNANCY, URINE   RAPID HIV SCREEN (HIV 1/2 AB+AG)     ____________________________________________   EKG  None  ____________________________________________    RADIOLOGY  [place] pending at time of sign out  ____________________________________________   PROCEDURES  Procedures  ____________________________________________   INITIAL IMPRESSION / ASSESSMENT AND PLAN / ED COURSE  Pertinent labs & imaging results that were available during my care of the patient were reviewed by me and considered in my medical decision making (see chart for details).  Patient presents to the emergency department today because of concerns for right pelvic pain. On exam patient does have some mild tenderness in the right neck. No CMT. Patient's wet prep was positive for clue cells. Ultrasounds were pending at time of sign out.  ____________________________________________   FINAL CLINICAL IMPRESSION(S) / ED DIAGNOSES  Final diagnoses:  Pelvic pain  BV (bacterial vaginosis)     Note: This dictation was prepared with Dragon dictation. Any transcriptional errors that result from this process are unintentional  Phineas SemenGraydon Yumi Insalaco, MD 07/29/16 [phone]

## 2016-07-29 NOTE — ED Notes (Signed)
Patient transported to Ultrasound 

## 2016-07-29 NOTE — Discharge Instructions (Addendum)
Please seek medical attention for any high fevers, chest pain, shortness of breath, change in behavior, persistent vomiting, bloody stool or any other new or concerning symptoms.  

## 2016-07-29 NOTE — ED Triage Notes (Signed)
Pt arrived to the ED for complaints of right pelvic pain. Pt states that she has a history of ovarian cyst and that this pain feels just like it. Pt is AOx4 in no apparent distress.

## 2016-07-29 NOTE — ED Notes (Signed)
Pt returned from US

## 2016-07-30 LAB — CHLAMYDIA/NGC RT PCR (ARMC ONLY)
CHLAMYDIA TR: NOT DETECTED
N gonorrhoeae: NOT DETECTED

## 2016-10-19 ENCOUNTER — Emergency Department
Admission: EM | Admit: 2016-10-19 | Discharge: 2016-10-19 | Disposition: A | Discharge status: Home / Self Care | Payer: Self-pay | Attending: Emergency Medicine | Admitting: Emergency Medicine

## 2016-10-19 ENCOUNTER — Emergency Department: Payer: Self-pay

## 2016-10-19 ENCOUNTER — Encounter: Payer: Self-pay | Admitting: Emergency Medicine

## 2016-10-19 DIAGNOSIS — Y999 Unspecified external cause status: Secondary | ICD-10-CM | POA: Insufficient documentation

## 2016-10-19 DIAGNOSIS — W2201XA Walked into wall, initial encounter: Secondary | ICD-10-CM | POA: Insufficient documentation

## 2016-10-19 DIAGNOSIS — J45909 Unspecified asthma, uncomplicated: Secondary | ICD-10-CM | POA: Insufficient documentation

## 2016-10-19 DIAGNOSIS — Y929 Unspecified place or not applicable: Secondary | ICD-10-CM | POA: Insufficient documentation

## 2016-10-19 DIAGNOSIS — Z79899 Other long term (current) drug therapy: Secondary | ICD-10-CM | POA: Insufficient documentation

## 2016-10-19 DIAGNOSIS — S60221A Contusion of right hand, initial encounter: Secondary | ICD-10-CM | POA: Insufficient documentation

## 2016-10-19 DIAGNOSIS — Y9389 Activity, other specified: Secondary | ICD-10-CM | POA: Insufficient documentation

## 2016-10-19 DIAGNOSIS — F17210 Nicotine dependence, cigarettes, uncomplicated: Secondary | ICD-10-CM | POA: Insufficient documentation

## 2016-10-19 MED ORDER — NAPROXEN 500 MG PO TABS: 500.0000 mg | ORAL_TABLET | Freq: Two times a day (BID) | ORAL | 0 refills | Status: DC

## 2016-10-19 NOTE — Discharge Instructions (Signed)
Follow-up with her primary care provider for symptoms that are not improving over the week. Return to the emergency department for symptoms that change or worsen if you are unable to schedule an appointment.

## 2016-10-19 NOTE — ED Provider Notes (Signed)
[facility] Emergency Department Provider Note ____________________________________________  Time seen: Approximately 10:38 PM  I have reviewed the triage vital signs and the nursing notes.   HISTORY  Chief Complaint Hand Injury    HPI Samantha Mendez is a 26 y.o. female who presents to the emergency department for evaluation of right hand pain. She states she punched a brick wall in anger last night and the pain has continued throughout the day. She has not taken any over the counter medications for pain. She denies previous fracture.  Past Medical History:  Diagnosis Date  . Asthma   . Back pain   . Migraine   . Sciatic leg pain     There are no active problems to display for this patient.   Past Surgical History:  Procedure Laterality Date  . ADENOIDECTOMY    . ADENOIDECTOMY    . MYRINGOTOMY WITH TUBE PLACEMENT    . TONSILLECTOMY      Prior to Admission medications   Medication Sig Start Date End Date Taking? Authorizing Provider  albuterol (PROVENTIL HFA;VENTOLIN HFA) 108 (90 Base) MCG/ACT inhaler Inhale 2 puffs into the lungs every 6 (six) hours as needed for wheezing.    Yes Historical Provider, MD  naproxen (NAPROSYN) 500 MG tablet Take 1 tablet (500 mg total) by mouth 2 (two) times daily with a meal. 10/19/16   Chinita Pester, FNP    Allergies Ceclor [cefaclor]; Keflex [cephalexin]; Latex; and Omnicef [cefdinir]  History reviewed. No pertinent family history.  Social History Social History  Substance Use Topics  . Smoking status: Current Every Day Smoker    Types: Cigarettes  . Smokeless tobacco: Never Used  . Alcohol use Yes     Comment: twice weekly    Review of Systems Constitutional: No recent illness. Cardiovascular: Denies chest pain or palpitations. Respiratory: Denies shortness of breath. Musculoskeletal: Pain in right hand Skin: Positive for abrasion to the right hand Neurological: Negative for focal weakness or  numbness.  ____________________________________________   PHYSICAL EXAM:  VITAL SIGNS: ED Triage Vitals  Enc Vitals Group     BP 10/19/16 2042 130/71     Pulse Rate 10/19/16 2042 83     Resp 10/19/16 2042 18     Temp 10/19/16 2042 98.6 F (37 C)     Temp Source 10/19/16 2042 Oral     SpO2 10/19/16 2042 98 %     Weight 10/19/16 2042 226 lb (102.5 kg)     Height 10/19/16 2042 5\' 5"  (1.651 m)     Head Circumference --      Peak Flow --      Pain Score 10/19/16 2043 7     Pain Loc --      Pain Edu? --      Excl. in GC? --     Constitutional: Alert and oriented. Well appearing and in no acute distress. Eyes: Conjunctivae are normal. EOMI. Head: Atraumatic. Neck: No stridor.  Respiratory: Normal respiratory effort.   Musculoskeletal: Diffuse tenderness over the MCPs of the 2nd-4th digits of the right hand. Full, active ROM of the hand and fingers. No pain in wrist or anatomic snuff box.  Neurologic:  Normal speech and language. No gross focal neurologic deficits are appreciated. Speech is normal. No gait instability. Skin:  Skin is warm, dry and intact. Atraumatic. Psychiatric: Mood and affect are normal. Speech and behavior are normal.  ____________________________________________   LABS (all labs ordered are listed, but only abnormal results are  displayed)  Labs Reviewed - No data to display ____________________________________________  RADIOLOGY  Right hand negative for acute bony abnormality per radiology. ____________________________________________   PROCEDURES  Procedure(s) performed: None  ____________________________________________   INITIAL IMPRESSION / ASSESSMENT AND PLAN / ED COURSE  26 year old female presenting to the emergency department for evaluation of right hand pain after punching a brick wall last night. Exam and x-ray both consistent with no acute bony abnormality. Patient to take Naprosyn and follow-up with primary care provider for  symptoms that are not improving over the week. She was advised to return to the emergency department for symptoms that change or worsen if some unable schedule an appointment.  Pertinent labs & imaging results that were available during my care of the patient were reviewed by me and considered in my medical decision making (see chart for details).  _________________________________________   FINAL CLINICAL IMPRESSION(S) / ED DIAGNOSES  Final diagnoses:  Contusion of right hand, initial encounter    Discharge Medication List as of 10/19/2016  9:39 PM    START taking these medications   Details  naproxen (NAPROSYN) 500 MG tablet Take 1 tablet (500 mg total) by mouth 2 (two) times daily with a meal., Starting Sun 10/19/2016, Print        If controlled substance prescribed during this visit, 12 month history viewed on the [facility] prior to issuing an initial prescription for Schedule II or III opiod.    Chinita PesterCari B Jourden Delmont, FNP 10/19/16 2244    Jennye MoccasinBrian S Quigley, MD 10/19/16 2328

## 2016-10-19 NOTE — ED Triage Notes (Signed)
Pt says she punched a wall in anger late last night; pain and mild swelling across knuckles of right hand; pain radiates up to wrist; full ROM but increases pain; abrasion across mid knuckle;

## 2016-11-03 ENCOUNTER — Encounter: Payer: Self-pay | Admitting: Emergency Medicine

## 2016-11-03 ENCOUNTER — Emergency Department
Admission: EM | Admit: 2016-11-03 | Discharge: 2016-11-03 | Disposition: A | Discharge status: Home / Self Care | Payer: Self-pay | Attending: Emergency Medicine | Admitting: Emergency Medicine

## 2016-11-03 DIAGNOSIS — Y990 Civilian activity done for income or pay: Secondary | ICD-10-CM | POA: Insufficient documentation

## 2016-11-03 DIAGNOSIS — X501XXA Overexertion from prolonged static or awkward postures, initial encounter: Secondary | ICD-10-CM | POA: Insufficient documentation

## 2016-11-03 DIAGNOSIS — Y92210 Daycare center as the place of occurrence of the external cause: Secondary | ICD-10-CM | POA: Insufficient documentation

## 2016-11-03 DIAGNOSIS — F17210 Nicotine dependence, cigarettes, uncomplicated: Secondary | ICD-10-CM | POA: Insufficient documentation

## 2016-11-03 DIAGNOSIS — Y9389 Activity, other specified: Secondary | ICD-10-CM | POA: Insufficient documentation

## 2016-11-03 DIAGNOSIS — Z79899 Other long term (current) drug therapy: Secondary | ICD-10-CM | POA: Insufficient documentation

## 2016-11-03 DIAGNOSIS — S39012A Strain of muscle, fascia and tendon of lower back, initial encounter: Secondary | ICD-10-CM | POA: Insufficient documentation

## 2016-11-03 DIAGNOSIS — J45909 Unspecified asthma, uncomplicated: Secondary | ICD-10-CM | POA: Insufficient documentation

## 2016-11-03 MED ORDER — CYCLOBENZAPRINE HCL 10 MG PO TABS: 10.0000 mg | ORAL_TABLET | Freq: Three times a day (TID) | ORAL | 0 refills | Status: DC | PRN

## 2016-11-03 NOTE — ED Triage Notes (Signed)
Pt reports low back pain that began yesterday. Pt denies NVD. Denies dysuria.

## 2016-11-03 NOTE — ED Provider Notes (Signed)
[facility] Emergency Department Provider Note ____________________________________________  Time seen: Approximately 7:46 PM  I have reviewed the triage vital signs and the nursing notes.   HISTORY  Chief Complaint Back Pain    HPI Samantha Mendez is a 26 y.o. 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 She has been taking Naprosyn and Tylenol without relief.  Past Medical History:  Diagnosis Date  . Asthma   . Back pain   . Migraine   . Sciatic leg pain     There are no active problems to display for this patient.   Past Surgical History:  Procedure Laterality Date  . ADENOIDECTOMY    . ADENOIDECTOMY    . MYRINGOTOMY WITH TUBE PLACEMENT    . TONSILLECTOMY      Prior to Admission medications   Medication Sig Start Date End Date Taking? Authorizing Provider  albuterol (PROVENTIL HFA;VENTOLIN HFA) 108 (90 Base) MCG/ACT inhaler Inhale 2 puffs into the lungs every 6 (six) hours as needed for wheezing.     Historical Provider, MD  cyclobenzaprine (FLEXERIL) 10 MG tablet Take 1 tablet (10 mg total) by mouth 3 (three) times daily as needed for muscle spasms. 11/03/16   Chinita Pester, FNP  naproxen (NAPROSYN) 500 MG tablet Take 1 tablet (500 mg total) by mouth 2 (two) times daily with a meal. 10/19/16   Chinita Pester, FNP    Allergies Ceclor [cefaclor]; Keflex [cephalexin]; Latex; and Omnicef [cefdinir]  No family history on file.  Social History Social History  Substance Use Topics  . Smoking status: Current Every Day Smoker    Types: Cigarettes  . Smokeless tobacco: Never Used  . Alcohol use Yes     Comment: twice weekly    Review of Systems Constitutional: No recent illness. Cardiovascular: Denies chest pain or  palpitations. Respiratory: Denies shortness of breath. Musculoskeletal: Pain in lower back Skin: Negative for rash, wound, lesion. Neurological: Negative for focal weakness or numbness. Negative for loss of bowel or bladder control.  ____________________________________________   PHYSICAL EXAM:  VITAL SIGNS: ED Triage Vitals  Enc Vitals Group     BP 11/03/16 1849 120/83     Pulse Rate 11/03/16 1849 73     Resp 11/03/16 1849 16     Temp 11/03/16 1849 98.3 F (36.8 C)     Temp Source 11/03/16 1849 Oral     SpO2 11/03/16 1849 100 %     Weight 11/03/16 1850 135 lb (61.2 kg)     Height 11/03/16 1850 5\' 5"  (1.651 m)     Head Circumference --      Peak Flow --      Pain Score 11/03/16 1901 8     Pain Loc --      Pain Edu? --      Excl. in GC? --     Constitutional: Alert and oriented. Well appearing and in no acute distress. Eyes: Conjunctivae are normal. EOMI. Head: Atraumatic. Neck: No stridor.  Respiratory: Normal respiratory effort.   Musculoskeletal: Paraspinal lumbar tenderness to palpation on the right side. No focal midline tenderness noted on exam. Patient able to change positions in bed, stand, and ambulate without assistance or antalgic gait. Neurologic:  Normal speech and language. No gross focal neurologic  deficits are appreciated. Speech is normal. No gait instability. Skin:  Skin is warm, dry and intact. Atraumatic. Psychiatric: Mood and affect are normal. Speech and behavior are normal.  ____________________________________________   LABS (all labs ordered are listed, but only abnormal results are displayed)  Labs Reviewed - No data to display ____________________________________________  RADIOLOGY  Not indicated ____________________________________________   PROCEDURES  Procedure(s) performed: None  ____________________________________________   INITIAL IMPRESSION / ASSESSMENT AND PLAN / ED COURSE  26 year old female with atraumatic lower back  pain. She will be given a prescription for Flexeril and advised to follow-up with the primary care provider for choice for symptoms that are not improving over the week. She was instructed to return to the emergency department for symptoms that change or worsen if she is unable to schedule an appointment.  Pertinent labs & imaging results that were available during my care of the patient were reviewed by me and considered in my medical decision making (see chart for details).  _________________________________________   FINAL CLINICAL IMPRESSION(S) / ED DIAGNOSES  Final diagnoses:  Strain of lumbar region, initial encounter    New Prescriptions   CYCLOBENZAPRINE (FLEXERIL) 10 MG TABLET    Take 1 tablet (10 mg total) by mouth 3 (three) times daily as needed for muscle spasms.    If controlled substance prescribed during this visit, 12 month history viewed on the [facility] prior to issuing an initial prescription for Schedule II or III opiod.    Chinita PesterCari B Saleah Rishel, FNP 11/03/16 2011    Rockne MenghiniAnne-Caroline Norman, MD 11/03/16 2356

## 2016-11-24 ENCOUNTER — Encounter: Payer: Self-pay | Admitting: Emergency Medicine

## 2016-11-24 DIAGNOSIS — F17210 Nicotine dependence, cigarettes, uncomplicated: Secondary | ICD-10-CM | POA: Insufficient documentation

## 2016-11-24 DIAGNOSIS — Z79899 Other long term (current) drug therapy: Secondary | ICD-10-CM | POA: Insufficient documentation

## 2016-11-24 DIAGNOSIS — J45909 Unspecified asthma, uncomplicated: Secondary | ICD-10-CM | POA: Insufficient documentation

## 2016-11-24 DIAGNOSIS — R51 Headache: Secondary | ICD-10-CM | POA: Insufficient documentation

## 2016-11-24 DIAGNOSIS — Z91040 Latex allergy status: Secondary | ICD-10-CM | POA: Insufficient documentation

## 2016-11-24 NOTE — ED Triage Notes (Signed)
Patient ambulatory to triage with steady gait, without difficulty or distress noted; pt reports x 10 days having frontal HA "wrapping around to the base of my skull" with no accomp symptoms

## 2016-11-25 ENCOUNTER — Emergency Department
Admission: EM | Admit: 2016-11-25 | Discharge: 2016-11-25 | Disposition: A | Discharge status: Home / Self Care | Payer: Self-pay | Attending: Emergency Medicine | Admitting: Emergency Medicine

## 2016-11-25 DIAGNOSIS — R51 Headache: Secondary | ICD-10-CM

## 2016-11-25 DIAGNOSIS — R519 Headache, unspecified: Secondary | ICD-10-CM

## 2016-11-25 MED ORDER — KETOROLAC TROMETHAMINE 30 MG/ML IJ SOLN
30.0000 mg | Freq: Once | INTRAMUSCULAR | Status: AC
  Administered 2016-11-25: 30 mg via INTRAVENOUS
  Filled 2016-11-25: qty 1

## 2016-11-25 MED ORDER — BUTALBITAL-APAP-CAFFEINE 50-325-40 MG PO TABS: 1.0000 | ORAL_TABLET | Freq: Four times a day (QID) | ORAL | 0 refills | Status: DC | PRN

## 2016-11-25 MED ORDER — SODIUM CHLORIDE 0.9 % IV BOLUS (SEPSIS)
1000.0000 mL | Freq: Once | INTRAVENOUS | Status: AC
  Administered 2016-11-25: 1000 mL via INTRAVENOUS

## 2016-11-25 MED ORDER — METOCLOPRAMIDE HCL 5 MG/ML IJ SOLN
10.0000 mg | Freq: Once | INTRAMUSCULAR | Status: AC
  Administered 2016-11-25: 10 mg via INTRAVENOUS
  Filled 2016-11-25: qty 2

## 2016-11-25 MED ORDER — DIPHENHYDRAMINE HCL 50 MG/ML IJ SOLN
25.0000 mg | Freq: Once | INTRAMUSCULAR | Status: AC
  Administered 2016-11-25: 25 mg via INTRAVENOUS
  Filled 2016-11-25: qty 1

## 2016-11-25 NOTE — ED Provider Notes (Signed)
[facility] Emergency Department Provider Note   ____________________________________________   First MD Initiated Contact with Patient 11/25/16 1239     (approximate)  I have reviewed the triage vital signs and the nursing notes.   HISTORY  Chief Complaint Headache    HPI Samantha Mendez is a 26 y.o. female who comes into the hospital today with a headache. She reports that she's had a headache for the past 10 days. She is unsure what triggered it but currently is beyond bearable. The patient is tried taking Aleve, Tylenol, BC powders but nothing has helped. The patient reports that it doesn't feel like a migraine. She has had headaches in the past but reports that this is a stabbing and burning. It goes from the front of her head around to the back. It has waxed and waned and has had some days where it has not been as severe but since yesterday has just been getting worse and worse. The patient reports that she was sent home from work today. The patient rates her pain 8-9 out of 10 in intensity. The patient has some mild sensitivity to light and sound but she says that she is really more irritable than anything. The patient denies any nausea or vomiting and denies any blurred vision. She reports that she has not had a headache this severe in about 6 months. The patient is here today for evaluation.   Past Medical History:  Diagnosis Date  . Asthma   . Back pain   . Migraine   . Sciatic leg pain     There are no active problems to display for this patient.   Past Surgical History:  Procedure Laterality Date  . ADENOIDECTOMY    . ADENOIDECTOMY    . MYRINGOTOMY WITH TUBE PLACEMENT    . TONSILLECTOMY      Prior to Admission medications   Medication Sig Start Date End Date Taking? Authorizing Provider  albuterol (PROVENTIL HFA;VENTOLIN HFA) 108 (90 Base) MCG/ACT inhaler Inhale 2 puffs into the lungs every 6 (six) hours as needed for wheezing.      Historical Provider, MD  butalbital-acetaminophen-caffeine (FIORICET, ESGIC) [phone] MG tablet Take 1-2 tablets by mouth every 6 (six) hours as needed for headache. 11/25/16 11/25/17  Rebecka Apley, MD  cyclobenzaprine (FLEXERIL) 10 MG tablet Take 1 tablet (10 mg total) by mouth 3 (three) times daily as needed for muscle spasms. 11/03/16   Chinita Pester, FNP  naproxen (NAPROSYN) 500 MG tablet Take 1 tablet (500 mg total) by mouth 2 (two) times daily with a meal. 10/19/16   Chinita Pester, FNP    Allergies Ceclor [cefaclor]; Keflex [cephalexin]; Latex; and Omnicef [cefdinir]  No family history on file.  Social History Social History  Substance Use Topics  . Smoking status: Current Every Day Smoker    Types: Cigarettes  . Smokeless tobacco: Never Used  . Alcohol use Yes     Comment: twice weekly    Review of Systems Constitutional: No fever/chills Eyes: No visual changes. ENT: No sore throat. Cardiovascular: Denies chest pain. Respiratory: Denies shortness of breath. Gastrointestinal: No abdominal pain.  No nausea, no vomiting.  No diarrhea.  No constipation. Genitourinary: Negative for dysuria. Musculoskeletal: Negative for back pain. Skin: Negative for rash. Neurological: Headache.  10-point ROS otherwise negative.  ____________________________________________   PHYSICAL EXAM:  VITAL SIGNS: ED Triage Vitals  Enc Vitals Group     BP 11/24/16 2058 135/75     Pulse  Rate 11/24/16 2058 73     Resp 11/24/16 2058 20     Temp 11/24/16 2058 98 F (36.7 C)     Temp Source 11/24/16 2058 Oral     SpO2 11/24/16 2058 99 %     Weight 11/24/16 2057 231 lb (104.8 kg)     Height 11/24/16 2057  (1.651 m)     Head Circumference --      Peak Flow --      Pain Score 11/24/16 2057 8     Pain Loc --      Pain Edu? --      Excl. in GC? --     Constitutional: Alert and oriented. Well appearing and in Moderate distress. Eyes: Conjunctivae are normal. PERRL. EOMI. Head:  Atraumatic. Nose: No congestion/rhinnorhea. Mouth/Throat: Mucous membranes are moist.  Oropharynx non-erythematous. Cardiovascular: Normal rate, regular rhythm. Grossly normal heart sounds.  Good peripheral circulation. Respiratory: Normal respiratory effort.  No retractions. Lungs CTAB. Gastrointestinal: Soft and nontender. No distention. Positive bowel sounds Musculoskeletal: No lower extremity tenderness nor edema.   Neurologic:  Normal speech and language. Cranial nerves II through XII are grossly intact with no focal motor or neuro deficits Skin:  Skin is warm, dry and intact.  Psychiatric: Mood and affect are normal.   ____________________________________________   LABS (all labs ordered are listed, but only abnormal results are displayed)  Labs Reviewed - No data to display ____________________________________________  EKG  none ____________________________________________  RADIOLOGY  none ____________________________________________   PROCEDURES  Procedure(s) performed: None  Procedures  Critical Care performed: No  ____________________________________________   INITIAL IMPRESSION / ASSESSMENT AND PLAN / ED COURSE  Pertinent labs & imaging results that were available during my care of the patient were reviewed by me and considered in my medical decision making (see chart for details).  This is a 26 year old female who comes into the hospital today with a headache. The patient reports that this is different from her typical headaches but she has had headaches this severe in the past. The patient reports that she just couldn't tolerate it anymore so she decided to come in and get checked out. The patient reports she has no insurance and she has been unable to follow-up with her primary care physician. I will not perform a CT scan of the patient's head at this time as she has had headaches this severe in the past. I will give her some Reglan, Benadryl and Toradol.  The patient will be reassessed fever medications.     The patient's headache is improved after the medication. She'll be discharged home to follow-up with neurology. ____________________________________________   FINAL CLINICAL IMPRESSION(S) / ED DIAGNOSES  Final diagnoses:  Acute nonintractable headache, unspecified headache type      NEW MEDICATIONS STARTED DURING THIS VISIT:  New Prescriptions   BUTALBITAL-ACETAMINOPHEN-CAFFEINE (FIORICET, ESGIC) [phone] MG TABLET    Take 1-2 tablets by mouth every 6 (six) hours as needed for headache.     Note:  This document was prepared using Dragon voice recognition software and may include unintentional dictation errors.    Rebecka Apley, MD 11/25/16 [phone]

## 2016-12-07 ENCOUNTER — Emergency Department: Payer: Self-pay

## 2016-12-07 ENCOUNTER — Emergency Department
Admission: EM | Admit: 2016-12-07 | Discharge: 2016-12-07 | Disposition: A | Discharge status: Home / Self Care | Payer: Self-pay | Attending: Emergency Medicine | Admitting: Emergency Medicine

## 2016-12-07 DIAGNOSIS — Y929 Unspecified place or not applicable: Secondary | ICD-10-CM | POA: Insufficient documentation

## 2016-12-07 DIAGNOSIS — S63642A Sprain of metacarpophalangeal joint of left thumb, initial encounter: Secondary | ICD-10-CM | POA: Insufficient documentation

## 2016-12-07 DIAGNOSIS — J45909 Unspecified asthma, uncomplicated: Secondary | ICD-10-CM | POA: Insufficient documentation

## 2016-12-07 DIAGNOSIS — Y999 Unspecified external cause status: Secondary | ICD-10-CM | POA: Insufficient documentation

## 2016-12-07 DIAGNOSIS — Z79899 Other long term (current) drug therapy: Secondary | ICD-10-CM | POA: Insufficient documentation

## 2016-12-07 DIAGNOSIS — Y9389 Activity, other specified: Secondary | ICD-10-CM | POA: Insufficient documentation

## 2016-12-07 DIAGNOSIS — F17210 Nicotine dependence, cigarettes, uncomplicated: Secondary | ICD-10-CM | POA: Insufficient documentation

## 2016-12-07 DIAGNOSIS — W2201XA Walked into wall, initial encounter: Secondary | ICD-10-CM | POA: Insufficient documentation

## 2016-12-07 MED ORDER — NAPROXEN 500 MG PO TABS: 500.0000 mg | ORAL_TABLET | Freq: Two times a day (BID) | ORAL | 0 refills | Status: DC
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
## 2016-12-07 NOTE — ED Notes (Signed)
Pt c/o left hand pain in her wrist and is able to perform full ROM and both hands are strong when she squeezes my fingers.  She says she is in 7/10 pain and has been taking Aleve for pain relief which has been helping some.

## 2017-01-13 ENCOUNTER — Encounter: Payer: Self-pay | Admitting: Emergency Medicine

## 2017-01-13 ENCOUNTER — Emergency Department
Admission: EM | Admit: 2017-01-13 | Discharge: 2017-01-14 | Disposition: A | Discharge status: Home / Self Care | Payer: Medicaid Other | Attending: Emergency Medicine | Admitting: Emergency Medicine

## 2017-01-13 ENCOUNTER — Emergency Department: Payer: Medicaid Other

## 2017-01-13 DIAGNOSIS — Z91040 Latex allergy status: Secondary | ICD-10-CM | POA: Diagnosis not present

## 2017-01-13 DIAGNOSIS — Z3491 Encounter for supervision of normal pregnancy, unspecified, first trimester: Secondary | ICD-10-CM

## 2017-01-13 DIAGNOSIS — Z3A08 8 weeks gestation of pregnancy: Secondary | ICD-10-CM | POA: Diagnosis not present

## 2017-01-13 DIAGNOSIS — F17210 Nicotine dependence, cigarettes, uncomplicated: Secondary | ICD-10-CM | POA: Insufficient documentation

## 2017-01-13 DIAGNOSIS — J45909 Unspecified asthma, uncomplicated: Secondary | ICD-10-CM | POA: Insufficient documentation

## 2017-01-13 DIAGNOSIS — R102 Pelvic and perineal pain: Secondary | ICD-10-CM | POA: Insufficient documentation

## 2017-01-13 DIAGNOSIS — O9989 Other specified diseases and conditions complicating pregnancy, childbirth and the puerperium: Secondary | ICD-10-CM | POA: Diagnosis not present

## 2017-01-13 LAB — URINALYSIS, COMPLETE (UACMP) WITH MICROSCOPIC
Bilirubin Urine: NEGATIVE
Glucose, UA: NEGATIVE mg/dL
HGB URINE DIPSTICK: NEGATIVE
Ketones, ur: NEGATIVE mg/dL
Leukocytes, UA: NEGATIVE
NITRITE: NEGATIVE
PROTEIN: NEGATIVE mg/dL
RBC / HPF: NONE SEEN RBC/hpf (ref 0–5)
Specific Gravity, Urine: 1.014 (ref 1.005–1.030)
pH: 6 (ref 5.0–8.0)

## 2017-01-13 LAB — CBC
HEMATOCRIT: 37.5 % (ref 35.0–47.0)
Hemoglobin: 13 g/dL (ref 12.0–16.0)
MCH: 32.1 pg (ref 26.0–34.0)
MCHC: 34.7 g/dL (ref 32.0–36.0)
MCV: 92.4 fL (ref 80.0–100.0)
Platelets: 209 10*3/uL (ref 150–440)
RBC: 4.06 MIL/uL (ref 3.80–5.20)
RDW: 13.7 % (ref 11.5–14.5)
WBC: 14.6 10*3/uL — ABNORMAL HIGH (ref 3.6–11.0)

## 2017-01-13 LAB — COMPREHENSIVE METABOLIC PANEL
ALT: 18 U/L (ref 14–54)
ANION GAP: 7 (ref 5–15)
AST: 19 U/L (ref 15–41)
Albumin: 3.8 g/dL (ref 3.5–5.0)
Alkaline Phosphatase: 52 U/L (ref 38–126)
BILIRUBIN TOTAL: 0.2 mg/dL — AB (ref 0.3–1.2)
BUN: 14 mg/dL (ref 6–20)
CO2: 26 mmol/L (ref 22–32)
Calcium: 8.8 mg/dL — ABNORMAL LOW (ref 8.9–10.3)
Chloride: 103 mmol/L (ref 101–111)
Creatinine, Ser: 0.71 mg/dL (ref 0.44–1.00)
GFR calc non Af Amer: >60 mL/min (ref >60)
Glucose, Bld: 83 mg/dL (ref 65–99)
POTASSIUM: 3.6 mmol/L (ref 3.5–5.1)
Sodium: 136 mmol/L (ref 135–145)
TOTAL PROTEIN: 7 g/dL (ref 6.5–8.1)

## 2017-01-13 NOTE — ED Triage Notes (Signed)
Pt in with co right lower quad pain that radiates to right lower back x 2 days. Denies any urinary burning or frequency but states pain to area worsens when she voids. Also has hx of ovarian cysts and states feels similar. Pt is [redacted] weeks pregnant, with no bleeding.

## 2017-01-13 NOTE — ED Notes (Signed)
Pt in US at this time 

## 2017-01-13 NOTE — ED Provider Notes (Signed)
[facility] Emergency Department Provider Note   First MD Initiated Contact with Patient 01/13/17 2330     (approximate)  I have reviewed the triage vital signs and the nursing notes.   HISTORY  Chief Complaint Abdominal Pain   HPI Samantha Mendez is a 26 y.o. female G4 P3 approximately [redacted] weeks pregnant presents to the emergency department with right pelvic discomfort 3 days. Patient states that the pain was intermittent at beginning however respiratory been constant over the last day. Patient states her current pain score is 7 out of 10. Patient denies any vaginal bleeding. She denies any urinary symptoms no dysuria no frequency or urgency. Patient denies any nausea or vomiting.   Past Medical History:  Diagnosis Date  . Asthma   . Back pain   . Migraine   . Sciatic leg pain     There are no active problems to display for this patient.   Past Surgical History:  Procedure Laterality Date  . ADENOIDECTOMY    . ADENOIDECTOMY    . MYRINGOTOMY WITH TUBE PLACEMENT    . TONSILLECTOMY      Prior to Admission medications   Medication Sig Start Date End Date Taking? Authorizing Provider  albuterol (PROVENTIL HFA;VENTOLIN HFA) 108 (90 Base) MCG/ACT inhaler Inhale 2 puffs into the lungs every 6 (six) hours as needed for wheezing.     [provider]  butalbital-acetaminophen-caffeine (FIORICET, ESGIC) [phone] MG tablet Take 1-2 tablets by mouth every 6 (six) hours as needed for headache. 11/25/16 11/25/17  Rebecka Apley, MD  cyclobenzaprine (FLEXERIL) 10 MG tablet Take 1 tablet (10 mg total) by mouth 3 (three) times daily as needed for muscle spasms. 11/03/16   Triplett, Rulon Eisenmenger B, FNP  naproxen (NAPROSYN) 500 MG tablet Take 1 tablet (500 mg total) by mouth 2 (two) times daily with a meal. 12/07/16   Cuthriell, Delorise Royals, PA-C    Allergies Ceclor [cefaclor]; Keflex [cephalexin]; Latex; and Omnicef [cefdinir]  No family history on  file.  Social History Social History  Substance Use Topics  . Smoking status: Current Every Day Smoker    Types: Cigarettes  . Smokeless tobacco: Never Used  . Alcohol use Yes     Comment: twice weekly    Review of Systems Constitutional: No fever/chills Eyes: No visual changes. ENT: No sore throat. Cardiovascular: Denies chest pain. Respiratory: Denies shortness of breath. Gastrointestinal: No abdominal pain.  No nausea, no vomiting.  No diarrhea.  No constipation. Genitourinary: Negative for dysuria. Positive for pelvic pain Musculoskeletal: Negative for neck pain.  Negative for back pain. Integumentary: Negative for rash. Neurological: Negative for headaches, focal weakness or numbness.   ____________________________________________   PHYSICAL EXAM:  VITAL SIGNS: ED Triage Vitals [01/13/17 2031]  Enc Vitals Group     BP (!) 140/119     Pulse Rate 86     Resp 20     Temp 97.7 F (36.5 C)     Temp Source Oral     SpO2 100 %     Weight 107.5 kg (237 lb)     Height 1.651 m (5\' 5" )     Head Circumference      Peak Flow      Pain Score 7     Pain Loc      Pain Edu?      Excl. in GC?     Constitutional: Alert and oriented. Well appearing and in no acute distress. Eyes: Conjunctivae are normal.  Head: Atraumatic. Mouth/Throat: Mucous membranes are moist.  Oropharynx non-erythematous. Neck: No stridor.   Cardiovascular: Normal rate, regular rhythm. Good peripheral circulation. Grossly normal heart sounds. Respiratory: Normal respiratory effort.  No retractions. Lungs CTAB. Gastrointestinal: Soft and nontender. No distention.  Musculoskeletal: No lower extremity tenderness nor edema. No gross deformities of extremities. Neurologic:  Normal speech and language. No gross focal neurologic deficits are appreciated.  Skin:  Skin is warm, dry and intact. No rash noted. Psychiatric: Mood and affect are normal. Speech and behavior are  normal.  ____________________________________________   LABS (all labs ordered are listed, but only abnormal results are displayed)  Labs Reviewed  CBC - Abnormal; Notable for the following:       Result Value   WBC 14.6 (*)    All other components within normal limits  COMPREHENSIVE METABOLIC PANEL - Abnormal; Notable for the following:    Calcium 8.8 (*)    Total Bilirubin 0.2 (*)    All other components within normal limits  URINALYSIS, COMPLETE (UACMP) WITH MICROSCOPIC - Abnormal; Notable for the following:    Color, Urine YELLOW (*)    APPearance CLEAR (*)    Bacteria, UA RARE (*)    Squamous Epithelial / LPF 0-5 (*)    All other components within normal limits  BETA HCG, QUANT    RADIOLOGY I, Bruno N Claretta Kendra, personally viewed and evaluated these images (plain radiographs) as part of my medical decision making, as well as reviewing the written report by the radiologist.  US Ob Comp Less 14 Wks  Result Date: 01/13/2017 CLINICAL DATA:  Pregnant patient in first-trimester pregnancy with lower abdominal pain for 2 days EXAM: OBSTETRIC <14 WK [place] AND TRANSVAGINAL OB US TECHNIQUE: Both transabdominal and transvaginal ultrasound examinations were performed for complete evaluation of the gestation as well as the maternal uterus, adnexal regions, and pelvic cul-de-sac. Transvaginal technique was performed to assess early pregnancy. COMPARISON:  None. FINDINGS: Intrauterine gestational sac: Single Yolk sac:  Visualized. Embryo:  Visualized. Cardiac Activity: Visualized. Heart Rate: 182  bpm CRL:  18.9  mm   8 w   3 d                  [place] EDC: 08/22/2017 Subchorionic hemorrhage:  None visualized. Maternal uterus/adnexae: Both ovaries are visualized with blood flow, possible corpus luteal cyst in the right ovary. Small volume simple free fluid in the pelvic cul-de-sac. IMPRESSION: Single live intrauterine pregnancy estimated gestational age [redacted] weeks 3 days for estimated date of delivery  08/22/2017. No complication. Small amount of simple free fluid in the pelvis is likely physiologic. Electronically Signed   By: Rubye Oaks M.D.   On: 01/13/2017 23:48   US Ob Transvaginal  Result Date: 01/13/2017 CLINICAL DATA:  Pregnant patient in first-trimester pregnancy with lower abdominal pain for 2 days EXAM: OBSTETRIC <14 WK [place] AND TRANSVAGINAL OB US TECHNIQUE: Both transabdominal and transvaginal ultrasound examinations were performed for complete evaluation of the gestation as well as the maternal uterus, adnexal regions, and pelvic cul-de-sac. Transvaginal technique was performed to assess early pregnancy. COMPARISON:  None. FINDINGS: Intrauterine gestational sac: Single Yolk sac:  Visualized. Embryo:  Visualized. Cardiac Activity: Visualized. Heart Rate: 182  bpm CRL:  18.9  mm   8 w   3 d                  [place] EDC: 08/22/2017 Subchorionic hemorrhage:  None visualized. Maternal uterus/adnexae: Both ovaries are visualized with blood flow,  possible corpus luteal cyst in the right ovary. Small volume simple free fluid in the pelvic cul-de-sac. IMPRESSION: Single live intrauterine pregnancy estimated gestational age [redacted] weeks 3 days for estimated date of delivery 08/22/2017. No complication. Small amount of simple free fluid in the pelvis is likely physiologic. Electronically Signed   By: Rubye OaksMelanie  Ehinger M.D.   On: 01/13/2017 23:48     Procedures   ____________________________________________   INITIAL IMPRESSION / ASSESSMENT AND PLAN / ED COURSE  Pertinent labs & imaging results that were available during my care of the patient were reviewed by me and considered in my medical decision making (see chart for details).        ____________________________________________  FINAL CLINICAL IMPRESSION(S) / ED DIAGNOSES  Final diagnoses:  First trimester pregnancy     MEDICATIONS GIVEN DURING THIS VISIT:  Medications - No data to display   NEW OUTPATIENT MEDICATIONS STARTED  DURING THIS VISIT:  New Prescriptions   No medications on file    Modified Medications   No medications on file    Discontinued Medications   No medications on file     Note:  This document was prepared using Dragon voice recognition software and may include unintentional dictation errors.    Darci CurrentBrown, [place] N, MD 01/14/17 [phone]

## 2017-01-13 NOTE — ED Notes (Signed)
MD Brown at bedside at this time.  

## 2017-01-13 NOTE — ED Notes (Signed)
Pt taken to US via stretcher

## 2017-01-13 NOTE — ED Notes (Addendum)
Pt states R lower pelvic pain. Hx ovarian cyst. Unsure if cyst or UTI. Denies frequency or painful urination but states she is [redacted] weeks pregnant and had a UTI with last prenancy. States ovarian cyst on R side in December. Denies N&V&D&fever. Alert and oriented.   States 4th pregnancy. 1 miscarriage, other 2 living- full term vaginal deliveries.

## 2017-01-14 NOTE — ED Notes (Signed)
Pt. Verbalizes understanding of d/c instructions and follow-up. VS stable and pain controlled per pt.  Pt. In NAD at time of d/c and denies further concerns regarding this visit. Pt. Stable at the time of departure from the unit, departing unit by the safest and most appropriate manner per that pt condition and limitations. Pt advised to return to the ED at any time for emergent concerns, or for new/worsening symptoms.   

## 2017-01-15 LAB — BETA HCG QUANT (REF LAB): Beta hCG, Tumor Marker: 92584 m[IU]/mL

## 2017-01-18 DIAGNOSIS — Z79899 Other long term (current) drug therapy: Secondary | ICD-10-CM | POA: Insufficient documentation

## 2017-01-18 DIAGNOSIS — Z3A09 9 weeks gestation of pregnancy: Secondary | ICD-10-CM | POA: Insufficient documentation

## 2017-01-18 DIAGNOSIS — Z91040 Latex allergy status: Secondary | ICD-10-CM | POA: Insufficient documentation

## 2017-01-18 DIAGNOSIS — R1030 Lower abdominal pain, unspecified: Secondary | ICD-10-CM | POA: Diagnosis present

## 2017-01-18 DIAGNOSIS — J45909 Unspecified asthma, uncomplicated: Secondary | ICD-10-CM | POA: Diagnosis not present

## 2017-01-18 DIAGNOSIS — F17210 Nicotine dependence, cigarettes, uncomplicated: Secondary | ICD-10-CM | POA: Insufficient documentation

## 2017-01-18 LAB — URINALYSIS, COMPLETE (UACMP) WITH MICROSCOPIC
BACTERIA UA: NONE SEEN
BILIRUBIN URINE: NEGATIVE
Glucose, UA: NEGATIVE mg/dL
HGB URINE DIPSTICK: NEGATIVE
KETONES UR: NEGATIVE mg/dL
LEUKOCYTES UA: NEGATIVE
NITRITE: NEGATIVE
Protein, ur: NEGATIVE mg/dL
Specific Gravity, Urine: 1.025 (ref 1.005–1.030)
pH: 7 (ref 5.0–8.0)

## 2017-01-18 LAB — COMPREHENSIVE METABOLIC PANEL
ALBUMIN: 3.8 g/dL (ref 3.5–5.0)
ALK PHOS: 48 U/L (ref 38–126)
ALT: 19 U/L (ref 14–54)
AST: 18 U/L (ref 15–41)
Anion gap: 6 (ref 5–15)
BILIRUBIN TOTAL: 0.4 mg/dL (ref 0.3–1.2)
BUN: 12 mg/dL (ref 6–20)
CALCIUM: 8.8 mg/dL — AB (ref 8.9–10.3)
CO2: 26 mmol/L (ref 22–32)
Chloride: 105 mmol/L (ref 101–111)
Creatinine, Ser: 0.66 mg/dL (ref 0.44–1.00)
GFR calc Af Amer: >60 mL/min (ref >60)
GFR calc non Af Amer: >60 mL/min (ref >60)
GLUCOSE: 101 mg/dL — AB (ref 65–99)
Potassium: 3.8 mmol/L (ref 3.5–5.1)
SODIUM: 137 mmol/L (ref 135–145)
Total Protein: 7.1 g/dL (ref 6.5–8.1)

## 2017-01-18 LAB — LIPASE, BLOOD: Lipase: 32 U/L (ref 11–51)

## 2017-01-18 LAB — CBC
HCT: 37.2 % (ref 35.0–47.0)
HEMOGLOBIN: 13.1 g/dL (ref 12.0–16.0)
MCH: 32.5 pg (ref 26.0–34.0)
MCHC: 35.1 g/dL (ref 32.0–36.0)
MCV: 92.5 fL (ref 80.0–100.0)
Platelets: 201 10*3/uL (ref 150–440)
RBC: 4.03 MIL/uL (ref 3.80–5.20)
RDW: 13.8 % (ref 11.5–14.5)
WBC: 12.1 10*3/uL — ABNORMAL HIGH (ref 3.6–11.0)

## 2017-01-18 NOTE — ED Triage Notes (Addendum)
Patient reports pain to right lower abdomen and right lower back.  Reports seen for same last week, description of pain different states now more crampy vs stabbing.  Patient reports nausea no vomiting.  Patient denies vaginal bleeding.

## 2017-01-19 ENCOUNTER — Emergency Department
Admission: EM | Admit: 2017-01-19 | Discharge: 2017-01-19 | Disposition: A | Discharge status: Home / Self Care | Payer: Medicaid Other | Attending: Emergency Medicine | Admitting: Emergency Medicine

## 2017-01-19 DIAGNOSIS — R1030 Lower abdominal pain, unspecified: Secondary | ICD-10-CM

## 2017-01-19 NOTE — ED Provider Notes (Signed)
[facility] Emergency Department Provider Note  Time seen: 1:22 AM  I have reviewed the triage vital signs and the nursing notes.   HISTORY  Chief Complaint Abdominal Pain    HPI [name] is a 26 y.o. female with a past medical history of migraines, back pain, [redacted] weeks pregnant, presents the emergency department with lower abdominal discomfort. According to the patient for the past 7-8 days she has been increasing lower abdominal discomfort. Patient was seen for the same 4 days ago, had a normal workup including an ultrasound. States she continues to have discomfort despite use of Tylenol. Denies any fever, nausea vomiting diarrhea or dysuria. Denies vaginal bleeding. Describes her pain as moderate.  Past Medical History:  Diagnosis Date  . Asthma   . Back pain   . Migraine   . Sciatic leg pain     There are no active problems to display for this patient.   Past Surgical History:  Procedure Laterality Date  . ADENOIDECTOMY    . ADENOIDECTOMY    . MYRINGOTOMY WITH TUBE PLACEMENT    . TONSILLECTOMY      Prior to Admission medications   Medication Sig Start Date End Date Taking? Authorizing Provider  albuterol (PROVENTIL HFA;VENTOLIN HFA) 108 (90 Base) MCG/ACT inhaler Inhale 2 puffs into the lungs every 6 (six) hours as needed for wheezing.     [provider]  butalbital-acetaminophen-caffeine (FIORICET, ESGIC) [phone] MG tablet Take 1-2 tablets by mouth every 6 (six) hours as needed for headache. 11/25/16 11/25/17  Rebecka Apley, MD  cyclobenzaprine (FLEXERIL) 10 MG tablet Take 1 tablet (10 mg total) by mouth 3 (three) times daily as needed for muscle spasms. 11/03/16   Triplett, Rulon Eisenmenger B, FNP  naproxen (NAPROSYN) 500 MG tablet Take 1 tablet (500 mg total) by mouth 2 (two) times daily with a meal. 12/07/16   Cuthriell, Delorise Royals, PA-C    Allergies  Allergen Reactions  . Ceclor [Cefaclor] Hives  . Keflex [Cephalexin] Hives  . Latex  Rash  . Omnicef [Cefdinir] Rash    No family history on file.  Social History Social History  Substance Use Topics  . Smoking status: Current Every Day Smoker    Types: Cigarettes  . Smokeless tobacco: Never Used  . Alcohol use Yes     Comment: twice weekly    Review of Systems Constitutional: Negative for fever Cardiovascular: Negative for chest pain. Respiratory: Negative for shortness of breath. Gastrointestinal:Lower abdominal pain. Negative for nausea vomiting or diarrhea Genitourinary: Negative for dysuria. Negative for vaginal bleeding Musculoskeletal: Some radiation to the lower back. Skin: Negative for rash. Neurological: Negative for headache All other ROS negative  ____________________________________________   PHYSICAL EXAM:  VITAL SIGNS: ED Triage Vitals  Enc Vitals Group     BP 01/18/17 2058 (!) 128/57     Pulse Rate 01/18/17 2058 75     Resp 01/18/17 2058 18     Temp 01/18/17 2058 98.5 F (36.9 C)     Temp Source 01/18/17 2058 Oral     SpO2 01/18/17 2058 99 %     Weight --      Height --      Head Circumference --      Peak Flow --      Pain Score 01/18/17 2057 8     Pain Loc --      Pain Edu? --      Excl. in GC? --     Constitutional: Alert  and oriented. Well appearing and in no distress. Eyes: Normal exam ENT   Head: Normocephalic and atraumatic.   Mouth/Throat: Mucous membranes are moist. Cardiovascular: Normal rate, regular rhythm. No murmur Respiratory: Normal respiratory effort without tachypnea nor retractions. Breath sounds are clear  Gastrointestinal: Soft, mild suprapubic tenderness, no rebound or guarding. No distention. No right lower quadrant tenderness. Musculoskeletal: Nontender with normal range of motion in all extremities Neurologic:  Normal speech and language. No gross focal neurologic deficits Skin:  Skin is warm, dry and intact.  Psychiatric: Mood and affect are  normal.  ____________________________________________    INITIAL IMPRESSION / ASSESSMENT AND PLAN / ED COURSE  Pertinent labs & imaging results that were available during my care of the patient were reviewed by me and considered in my medical decision making (see chart for details).  Patient presents for continued lower abdominal pain. Patient had a workup performed 4 days ago including an ultrasound showing an 8 week 3 day live and she uterine pregnancy. Bedside ultrasound performed by myself tonight shows a fetus within an amniotic sac with a heart flicker present. Patient's labs are normal. As the pain is been ongoing for 8 days with a normal ultrasound performed 4 days ago a normal bedside ultrasound performed by myself, normal vitals afebrile with very slight abdominal tenderness believe the patient is safe for discharge home with OB/GYN follow-up. Patient states she has an appointment next Monday, one week from tomorrow. I discussed return precautions for any worsening pain or fever. Patient agreeable.  ____________________________________________   FINAL CLINICAL IMPRESSION(S) / ED DIAGNOSES  Lower abdominal pain    Minna AntisPaduchowski, Nevaeha Finerty, MD 01/19/17 0130

## 2017-01-20 LAB — BETA HCG QUANT (REF LAB): Beta hCG, Tumor Marker: 90327 m[IU]/mL

## 2017-01-27 ENCOUNTER — Other Ambulatory Visit (HOSPITAL_COMMUNITY): Payer: Self-pay | Admitting: Family Medicine

## 2017-01-27 DIAGNOSIS — Z369 Encounter for antenatal screening, unspecified: Secondary | ICD-10-CM

## 2017-01-27 LAB — OB RESULTS CONSOLE HIV ANTIBODY (ROUTINE TESTING): HIV: NONREACTIVE

## 2017-01-28 ENCOUNTER — Other Ambulatory Visit: Payer: Self-pay | Admitting: Internal Medicine

## 2017-01-28 LAB — OB RESULTS CONSOLE HEPATITIS B SURFACE ANTIGEN: Hepatitis B Surface Ag: NEGATIVE

## 2017-01-28 LAB — OB RESULTS CONSOLE RPR: RPR: NONREACTIVE

## 2017-01-29 LAB — OB RESULTS CONSOLE GC/CHLAMYDIA
Chlamydia: NEGATIVE
GC PROBE AMP, GENITAL: NEGATIVE

## 2017-02-09 ENCOUNTER — Ambulatory Visit
Admission: RE | Admit: 2017-02-09 | Discharge: 2017-02-09 | Disposition: A | Discharge status: Home / Self Care | Payer: Medicaid Other | Source: Ambulatory Visit | Attending: Obstetrics and Gynecology | Admitting: Obstetrics and Gynecology

## 2017-02-09 ENCOUNTER — Ambulatory Visit (HOSPITAL_BASED_OUTPATIENT_CLINIC_OR_DEPARTMENT_OTHER)
Admission: RE | Admit: 2017-02-09 | Discharge: 2017-02-09 | Disposition: A | Discharge status: Home / Self Care | Payer: Medicaid Other | Source: Ambulatory Visit | Attending: Obstetrics and Gynecology | Admitting: Obstetrics and Gynecology

## 2017-02-09 VITALS — BP 122/57 | HR 99 | Temp 98.4°F | Resp 18 | Ht 65.0 in | Wt 241.8 lb

## 2017-02-09 DIAGNOSIS — Z369 Encounter for antenatal screening, unspecified: Secondary | ICD-10-CM

## 2017-02-09 DIAGNOSIS — Z833 Family history of diabetes mellitus: Secondary | ICD-10-CM | POA: Diagnosis not present

## 2017-02-09 DIAGNOSIS — Z3A12 12 weeks gestation of pregnancy: Secondary | ICD-10-CM | POA: Insufficient documentation

## 2017-02-09 DIAGNOSIS — Z3689 Encounter for other specified antenatal screening: Secondary | ICD-10-CM | POA: Diagnosis present

## 2017-02-09 DIAGNOSIS — Z8249 Family history of ischemic heart disease and other diseases of the circulatory system: Secondary | ICD-10-CM | POA: Insufficient documentation

## 2017-02-09 DIAGNOSIS — F17210 Nicotine dependence, cigarettes, uncomplicated: Secondary | ICD-10-CM | POA: Diagnosis not present

## 2017-02-09 DIAGNOSIS — O283 Abnormal ultrasonic finding on antenatal screening of mother: Secondary | ICD-10-CM | POA: Diagnosis not present

## 2017-02-09 DIAGNOSIS — O99331 Smoking (tobacco) complicating pregnancy, first trimester: Secondary | ICD-10-CM | POA: Diagnosis not present

## 2017-02-09 NOTE — Progress Notes (Addendum)
Referring physician:  [facility] Length of Consultation: 45 minutes   Samantha Mendez  was [facility] for genetic counseling to review prenatal screening and testing options.  This note summarizes the information we discussed.    We offered the following routine screening tests for this pregnancy:  First trimester screening, which includes nuchal translucency ultrasound screen and first trimester maternal serum marker screening.  The nuchal translucency has approximately an 80% detection rate for Down syndrome and can be positive for other chromosome abnormalities as well as congenital heart defects.  When combined with a maternal serum marker screening, the detection rate is up to 90% for Down syndrome and up to 97% for trisomy 18.     Maternal serum marker screening, a blood test that measures pregnancy proteins, can provide risk assessments for Down syndrome, trisomy 18, and open neural tube defects (spina bifida, anencephaly). Because it does not directly examine the fetus, it cannot positively diagnose or rule out these problems.  Targeted ultrasound uses high frequency sound waves to create an image of the developing fetus.  An ultrasound is often recommended as a routine means of evaluating the pregnancy.  It is also used to screen for fetal anatomy problems (for example, a heart defect) that might be suggestive of a chromosomal or other abnormality.   Should these screening tests indicate an increased concern, then the following additional testing options would be offered:  The chorionic villus sampling procedure is available for first trimester chromosome analysis.  This involves the withdrawal of a small amount of chorionic villi (tissue from the developing placenta).  Risk of pregnancy loss is estimated to be approximately 1 in 200 to 1 in 100 (0.5 to 1%).  There is approximately a 1% (1 in 100) chance that the CVS chromosome  results will be unclear.  Chorionic villi cannot be tested for neural tube defects.     Amniocentesis involves the removal of a small amount of amniotic fluid from the sac surrounding the fetus with the use of a thin needle inserted through the maternal abdomen and uterus.  Ultrasound guidance is used throughout the procedure.  Fetal cells from amniotic fluid are directly evaluated and > 99.5% of chromosome problems and > 98% of open neural tube defects can be detected. This procedure is generally performed after the 15th week of pregnancy.  The main risks to this procedure include complications leading to miscarriage in less than 1 in 200 cases (0.5%).  As another option for information if the pregnancy is suspected to be an an increased chance for certain chromosome conditions, we also reviewed the availability of cell free fetal DNA testing from maternal blood to determine whether or not the baby may have either Down syndrome, trisomy 9713, or trisomy 3818.  This test utilizes a maternal blood sample and DNA sequencing technology to isolate circulating cell free fetal DNA from maternal plasma.  The fetal DNA can then be analyzed for DNA sequences that are derived from the three most common chromosomes involved in aneuploidy, chromosomes 13, 18, and 21.  If the overall amount of DNA is greater than the expected level for any of these chromosomes, aneuploidy is suspected.  While we do not consider it a replacement for invasive testing and karyotype analysis, a negative result from this testing would be reassuring, though not a guarantee of a normal chromosome complement for the baby.  An abnormal result is certainly suggestive of an abnormal chromosome  complement, though we would still recommend CVS or amniocentesis to confirm any findings from this testing.  Cystic Fibrosis and Spinal Muscular Atrophy (SMA) screening were also discussed with the patient. Both conditions are recessive, which means that both  parents must be carriers in order to have a child with the disease.  Cystic fibrosis (CF) is one of the most common genetic conditions in persons of Caucasian ancestry.  This condition occurs in approximately 1 in 2,500 Caucasian persons and results in thickened secretions in the lungs, digestive, and reproductive systems.  For a baby to be at risk for having CF, both of the parents must be carriers for this condition.  Approximately 1 in 47 Caucasian persons is a carrier for CF.  Current carrier testing looks for the most common mutations in the gene for CF and can detect approximately 90% of carriers in the Caucasian population.  This means that the carrier screening can greatly reduce, but cannot eliminate, the chance for an individual to have a child with CF.  If an individual is found to be a carrier for CF, then carrier testing would be available for the partner. As part of [place]'s newborn screening profile, all babies born in the state of [place] will have a two-tier screening process.  Specimens are first tested to determine the concentration of immunoreactive trypsinogen (IRT).  The top 5% of specimens with the highest IRT values then undergo DNA testing using a panel of over 40 common CF mutations. SMA is a neurodegenerative disorder that leads to atrophy of skeletal muscle and overall weakness.  This condition is also more prevalent in the Caucasian population, with 1 in 40-1 in 60 persons being a carrier and 1 in 6,000-1 in 10,000 children being affected.  There are multiple forms of the disease, with some causing death in infancy to other forms with survival into adulthood.  The genetics of SMA is complex, but carrier screening can detect up to 95% of carriers in the Caucasian population.  Similar to CF, a negative result can greatly reduce, but cannot eliminate, the chance to have a child with SMA. The patient reported that she is of Caucasian ancestry and the father of the baby is of  mixed [place] ancestry.  We obtained a detailed family history and pregnancy history.  The patient reported family members with heart disease, high blood pressure and diabetes, which we reviewed are likely to have both genetic and lifestyle factors.  The remainder of the family history was reported to be unremarkable for birth defects, intellectual delays, recurrent pregnancy loss or known chromosome abnormalities.  Samantha Mendez stated that this is her fourth pregnancy.  She had one miscarriage and one healthy 71 year old son with her first partner.  She and her current partner have a 26 year old son who is also in good health.  She reported no complications in this pregnancy except for headaches and side pain, which she is taking tylenol for.  She reported smoking 2-3 packs of cigarettes per week.  As we discussed, smoking during pregnancy has been associated with low birth weight, premature delivery and pregnancy loss.  For this reason, we suggest that she cut back or avoid smoking for the remainder of the pregnancy.  After consideration of the options, Samantha Mendez elected to proceed with first trimester screening.  She declined carrier screening for CF, SMA and hemoglobinopathies.  An ultrasound was performed at the time of the visit.  The gestational age  was consistent with 12 weeks.  Fetal anatomy could not be assessed due to early gestational age.  Please refer to the ultrasound report for details of that study.  The ultrasound revealed the nuchal translucency measurement to be 3.53mm, which is increased.  An thickened NT is known to increase the chance for a chromosome condition, structural heart defect or other genetic syndrome in the pregnancy.  Babies with increased NT may also be healthy.  Because of the increased chance for genetic syndromes or other anomalies, we offered cell free fetal DNA testing, invasive testing through amniocentesis or CVS, detailed anatomy ultrasound at [redacted] weeks  gestation and a fetal echocardiogram after [redacted] weeks gestation.  Samantha Mendez elected to decline invasive testing at this time and opted for cell free fetal DNA testing today.  Results should be available within 2 weeks.  Samantha Mendez was encouraged to call with questions or concerns.  We can be contacted at [phone].    Cherly Anderson, Samantha, CGC I saw Samantha Mendez and reviewed the findings of thick NT at 3.78mm  and options for testing . She  Will obtain cell free DNA testing via [employer] and get a detailed anatomy scan and fetal echo.   I saw the patient and agree with plan  Jimmey Ralph, MD

## 2017-02-16 ENCOUNTER — Telehealth: Payer: Self-pay | Admitting: Obstetrics and Gynecology

## 2017-02-16 NOTE — Addendum Note (Signed)
Encounter addended by: Jimmey RalphLivingston, Krystin Keeven, MD on: 02/16/2017 11:27 AM<BR>    Actions taken: Sign clinical note

## 2017-02-16 NOTE — Telephone Encounter (Signed)
The patient was informed of the results of her recent Inform[employer] testing (performed at [facility]) which yielded NEGATIVE results.  The patient's specimen showed DNA consistent with two copies of chromosomes 21, 18 and 13.  The sensitivity for trisomy 9321, trisomy 2518 and trisomy 4513 using this testing are reported as 99.1%, 98.3% and 98.1% respectively.  Thus, while the results of this testing are highly accurate, they are not considered diagnostic at this time.  Should more definitive information be desired, the patient may still consider amniocentesis.   As requested to know by the patient, sex chromosome analysis was included for this sample.  Results was consistent with a female (XY) fetus. This is predicted with >99% accuracy.  A maternal serum AFP only should be considered if screening for neural tube defects is desired.  Though these results significantly reduce the chance for a chromosome condition, the increased NT from the ultrasound still warrants a detailed anatomy ultrasound and fetal echocardiogram.  In addition, if the patient desires additional information, she can still consider amniocentesis for chromosome conditions.  No testing is available to rule out all genetic syndromes or birth defects prenatally.  Cherly Andersoneborah F. Callista Hoh, MS, CGC

## 2017-02-17 LAB — INFORMASEQ(SM) WITH XY ANALYSIS
Fetal Fraction (%):: 8
Fetal Number: 1
Gestational Age at Collection: 12.1 weeks
Weight: 241 [lb_av]

## 2017-03-23 ENCOUNTER — Ambulatory Visit
Admission: RE | Admit: 2017-03-23 | Discharge: 2017-03-23 | Disposition: A | Discharge status: Home / Self Care | Payer: Medicaid Other | Source: Ambulatory Visit | Attending: Obstetrics and Gynecology | Admitting: Obstetrics and Gynecology

## 2017-03-23 DIAGNOSIS — O350XX0 Maternal care for (suspected) central nervous system malformation in fetus, not applicable or unspecified: Secondary | ICD-10-CM | POA: Insufficient documentation

## 2017-03-23 DIAGNOSIS — Z3A18 18 weeks gestation of pregnancy: Secondary | ICD-10-CM | POA: Insufficient documentation

## 2017-03-23 DIAGNOSIS — Z3689 Encounter for other specified antenatal screening: Secondary | ICD-10-CM | POA: Diagnosis not present

## 2017-03-23 DIAGNOSIS — IMO0002 Reserved for concepts with insufficient information to code with codable children: Secondary | ICD-10-CM

## 2017-03-23 DIAGNOSIS — Z369 Encounter for antenatal screening, unspecified: Secondary | ICD-10-CM | POA: Diagnosis present

## 2017-04-07 ENCOUNTER — Emergency Department
Admission: EM | Admit: 2017-04-07 | Discharge: 2017-04-07 | Disposition: A | Discharge status: Home / Self Care | Payer: Medicaid Other | Attending: Emergency Medicine | Admitting: Emergency Medicine

## 2017-04-07 ENCOUNTER — Encounter: Payer: Self-pay | Admitting: Emergency Medicine

## 2017-04-07 DIAGNOSIS — O9952 Diseases of the respiratory system complicating childbirth: Secondary | ICD-10-CM | POA: Insufficient documentation

## 2017-04-07 DIAGNOSIS — Z79899 Other long term (current) drug therapy: Secondary | ICD-10-CM | POA: Insufficient documentation

## 2017-04-07 DIAGNOSIS — J40 Bronchitis, not specified as acute or chronic: Secondary | ICD-10-CM | POA: Diagnosis not present

## 2017-04-07 DIAGNOSIS — F17200 Nicotine dependence, unspecified, uncomplicated: Secondary | ICD-10-CM | POA: Insufficient documentation

## 2017-04-07 DIAGNOSIS — Z3A00 Weeks of gestation of pregnancy not specified: Secondary | ICD-10-CM | POA: Insufficient documentation

## 2017-04-07 DIAGNOSIS — O99330 Smoking (tobacco) complicating pregnancy, unspecified trimester: Secondary | ICD-10-CM | POA: Insufficient documentation

## 2017-04-07 DIAGNOSIS — J209 Acute bronchitis, unspecified: Secondary | ICD-10-CM

## 2017-04-07 DIAGNOSIS — Z91040 Latex allergy status: Secondary | ICD-10-CM | POA: Insufficient documentation

## 2017-04-07 DIAGNOSIS — R0981 Nasal congestion: Secondary | ICD-10-CM | POA: Diagnosis present

## 2017-04-07 MED ORDER — AZITHROMYCIN 250 MG PO TABS: ORAL_TABLET | ORAL | 0 refills | Status: AC

## 2017-04-07 NOTE — ED Triage Notes (Signed)
C/O sinus congestion and cough since Friday.

## 2017-04-07 NOTE — ED Notes (Signed)
Pt alert and oriented X4, active, cooperative, pt in NAD. RR even and unlabored, color WNL.  Pt informed to return if any life threatening symptoms occur.   

## 2017-04-07 NOTE — ED Provider Notes (Signed)
[facility] Emergency Department Provider Note  ____________________________________________  Time seen: Approximately 5:49 PM  I have reviewed the triage vital signs and the nursing notes.   HISTORY  Chief Complaint sinus congestion and URI    HPI Samantha Mendez is a 26 y.o. female presents to the emergency department with congestion, rhinorrhea, and nonproductive cough for approximately 1 week. Patient denies fever and chills. Patient states that she commonly develops sinusitis and bronchitis after viral URIs. She denies chest pain, chest tightness, shortness of breath, nausea, vomiting, abdominal pain, diarrhea, vaginal bleeding or a gush of vaginal fluid. No alleviating measures have been attempted.   Past Medical History:  Diagnosis Date  . Asthma   . Back pain   . Migraine   . Sciatic leg pain     Patient Active Problem List   Diagnosis Date Noted  . thick NT on first tri screen     Past Surgical History:  Procedure Laterality Date  . ADENOIDECTOMY    . ADENOIDECTOMY    . MYRINGOTOMY WITH TUBE PLACEMENT    . TONSILLECTOMY      Prior to Admission medications   Medication Sig Start Date End Date Taking? Authorizing Provider  albuterol (PROVENTIL HFA;VENTOLIN HFA) 108 (90 Base) MCG/ACT inhaler Inhale 2 puffs into the lungs every 6 (six) hours as needed for wheezing.     [provider]  azithromycin (ZITHROMAX Z-PAK) 250 MG tablet Take 2 tablets (500 mg) on  Day 1,  followed by 1 tablet (250 mg) once daily on Days 2 through 5. 04/07/17 04/12/17  Orvil Feil, PA-C  butalbital-acetaminophen-caffeine (FIORICET, ESGIC) [phone] MG tablet Take 1-2 tablets by mouth every 6 (six) hours as needed for headache. Patient not taking: Reported on 02/09/2017 11/25/16 11/25/17  Rebecka Apley, MD  cyclobenzaprine (FLEXERIL) 10 MG tablet Take 1 tablet (10 mg total) by mouth 3 (three) times daily as needed for muscle spasms. Patient not taking:  Reported on 02/09/2017 11/03/16   Kem Boroughs B, FNP  naproxen (NAPROSYN) 500 MG tablet Take 1 tablet (500 mg total) by mouth 2 (two) times daily with a meal. Patient not taking: Reported on 02/09/2017 12/07/16   Cuthriell, Delorise Royals, PA-C  Prenatal Vit-Fe Fumarate-FA (PRENATAL MULTIVITAMIN) TABS tablet Take 1 tablet by mouth daily at 12 noon.    [provider]  vitamin B-12 (CYANOCOBALAMIN) 1000 MCG tablet Take 1,000 mcg by mouth daily.    [provider]    Allergies Ceclor [cefaclor]; Keflex [cephalexin]; Latex; and Omnicef [cefdinir]  No family history on file.  Social History Social History  Substance Use Topics  . Smoking status: Current Every Day Smoker    Types: Cigarettes  . Smokeless tobacco: Never Used  . Alcohol use Yes     Comment: twice weekly/ not durning pregnancy     Review of Systems  Constitutional: Patient has been afebrile Eyes: No visual changes. No discharge ENT: Patient has had congestion.  Cardiovascular: no chest pain. Respiratory: Patient has had non-productive cough.  No SOB. Gastrointestinal: No nausea, vomiting or diarrhea. Genitourinary: Negative for dysuria. No hematuria Musculoskeletal: Patient has had myalgias. Skin: Negative for rash, abrasions, lacerations, ecchymosis. Neurological: Negative for headaches, focal weakness or numbness.    ____________________________________________   PHYSICAL EXAM:  VITAL SIGNS: ED Triage Vitals  Enc Vitals Group     BP 04/07/17 1646 (!) 108/55     Pulse Rate 04/07/17 1646 86     Resp 04/07/17 1646 16  Temp 04/07/17 1646 98.3 F (36.8 C)     Temp Source 04/07/17 1646 Oral     SpO2 04/07/17 1646 99 %     Weight 04/07/17 1644 247 lb (112 kg)     Height 04/07/17 1644 5\' 5"  (1.651 m)     Head Circumference --      Peak Flow --      Pain Score 04/07/17 1644 5     Pain Loc --      Pain Edu? --      Excl. in GC? --      Constitutional: Alert and oriented. Well appearing and  in no acute distress. Eyes: Conjunctivae are normal. PERRL. EOMI. Head: Atraumatic. ENT:      Ears: Tympanic membranes are effused bilaterally.      Nose: No congestion/rhinnorhea.      Mouth/Throat: Mucous membranes are moist. Posterior pharynx is mildly erythematous. Cardiovascular: Normal rate, regular rhythm. Normal S1 and S2.  Good peripheral circulation. Respiratory: Normal respiratory effort without tachypnea or retractions. Lungs CTAB. Good air entry to the bases with no decreased or absent breath sounds. Skin:  Skin is warm, dry and intact. No rash noted. Psychiatric: Mood and affect are normal. Speech and behavior are normal. Patient exhibits appropriate insight and judgement.   ____________________________________________   LABS (all labs ordered are listed, but only abnormal results are displayed)  Labs Reviewed - No data to display ____________________________________________  EKG   ____________________________________________  RADIOLOGY   No results found.  ____________________________________________    PROCEDURES  Procedure(s) performed:    Procedures    Medications - No data to display   ____________________________________________   INITIAL IMPRESSION / ASSESSMENT AND PLAN / ED COURSE  Pertinent labs & imaging results that were available during my care of the patient were reviewed by me and considered in my medical decision making (see chart for details).  Review of the [place] CSRS was performed in accordance of the [facility] prior to dispensing any controlled drugs.     Assessment and plan Sinusitis Patient presents to the emergency department with nasal congestion, nonproductive cough, rhinorrhea and myalgias for approximately 1 week. Patient was discharged with Augmentin. Patient was advised to follow-up with primary care in 1 week. Fetal heart tones were assessed and documented by nursing staff. Vital signs are reassuring prior to discharge.  All patient questions were answered.   ____________________________________________  FINAL CLINICAL IMPRESSION(S) / ED DIAGNOSES  Final diagnoses:  Acute bronchitis, unspecified organism      NEW MEDICATIONS STARTED DURING THIS VISIT:  New Prescriptions   AZITHROMYCIN (ZITHROMAX Z-PAK) 250 MG TABLET    Take 2 tablets (500 mg) on  Day 1,  followed by 1 tablet (250 mg) once daily on Days 2 through 5.        This chart was dictated using voice recognition software/Dragon. Despite best efforts to proofread, errors can occur which can change the meaning. Any change was purely unintentional.    Orvil Feil, PA-C 04/07/17 2014    Merrily Brittle, MD 04/07/17 2219

## 2017-04-07 NOTE — ED Notes (Signed)
See triage note  States she developed some sinus pressure and congestion last week  Now feels like congestion is in chest  Occasional prod cough  Min relief with inhalers and SVN

## 2017-04-20 ENCOUNTER — Other Ambulatory Visit: Payer: Self-pay | Admitting: *Deleted

## 2017-04-20 DIAGNOSIS — IMO0001 Reserved for inherently not codable concepts without codable children: Secondary | ICD-10-CM

## 2017-04-23 ENCOUNTER — Ambulatory Visit
Admission: RE | Admit: 2017-04-23 | Discharge: 2017-04-23 | Disposition: A | Discharge status: Home / Self Care | Payer: Medicaid Other | Source: Ambulatory Visit | Attending: Maternal & Fetal Medicine | Admitting: Maternal & Fetal Medicine

## 2017-04-23 VITALS — BP 127/56 | HR 76 | Temp 98.2°F | Resp 18 | Wt 251.0 lb

## 2017-04-23 DIAGNOSIS — Z369 Encounter for antenatal screening, unspecified: Secondary | ICD-10-CM

## 2017-04-23 DIAGNOSIS — O350XX0 Maternal care for (suspected) central nervous system malformation in fetus, not applicable or unspecified: Secondary | ICD-10-CM | POA: Diagnosis present

## 2017-04-23 DIAGNOSIS — Z3A22 22 weeks gestation of pregnancy: Secondary | ICD-10-CM | POA: Diagnosis not present

## 2017-04-23 DIAGNOSIS — IMO0001 Reserved for inherently not codable concepts without codable children: Secondary | ICD-10-CM

## 2017-05-25 ENCOUNTER — Ambulatory Visit
Admission: RE | Admit: 2017-05-25 | Discharge: 2017-05-25 | Disposition: A | Discharge status: Home / Self Care | Payer: Medicaid Other | Source: Ambulatory Visit | Attending: Obstetrics and Gynecology | Admitting: Obstetrics and Gynecology

## 2017-05-25 VITALS — BP 121/52 | HR 83 | Temp 97.9°F | Resp 18 | Ht 65.0 in | Wt 256.2 lb

## 2017-05-25 DIAGNOSIS — E669 Obesity, unspecified: Secondary | ICD-10-CM | POA: Diagnosis not present

## 2017-05-25 DIAGNOSIS — G930 Cerebral cysts: Secondary | ICD-10-CM | POA: Insufficient documentation

## 2017-05-25 DIAGNOSIS — Z3689 Encounter for other specified antenatal screening: Secondary | ICD-10-CM | POA: Insufficient documentation

## 2017-05-25 DIAGNOSIS — O350XX0 Maternal care for (suspected) central nervous system malformation in fetus, not applicable or unspecified: Secondary | ICD-10-CM | POA: Insufficient documentation

## 2017-05-25 DIAGNOSIS — O99210 Obesity complicating pregnancy, unspecified trimester: Secondary | ICD-10-CM

## 2017-05-25 DIAGNOSIS — O99212 Obesity complicating pregnancy, second trimester: Secondary | ICD-10-CM | POA: Diagnosis not present

## 2017-05-25 DIAGNOSIS — Z3A27 27 weeks gestation of pregnancy: Secondary | ICD-10-CM | POA: Insufficient documentation

## 2017-05-25 DIAGNOSIS — IMO0001 Reserved for inherently not codable concepts without codable children: Secondary | ICD-10-CM

## 2017-05-25 DIAGNOSIS — O99352 Diseases of the nervous system complicating pregnancy, second trimester: Secondary | ICD-10-CM | POA: Diagnosis not present

## 2017-05-25 DIAGNOSIS — Z369 Encounter for antenatal screening, unspecified: Secondary | ICD-10-CM

## 2017-06-22 ENCOUNTER — Ambulatory Visit
Admission: RE | Admit: 2017-06-22 | Discharge: 2017-06-22 | Disposition: A | Discharge status: Home / Self Care | Payer: Medicaid Other | Source: Ambulatory Visit | Attending: Obstetrics and Gynecology | Admitting: Obstetrics and Gynecology

## 2017-06-22 VITALS — BP 116/51 | HR 80 | Temp 97.7°F | Resp 18 | Ht 65.0 in | Wt 256.4 lb

## 2017-06-22 DIAGNOSIS — O3663X0 Maternal care for excessive fetal growth, third trimester, not applicable or unspecified: Secondary | ICD-10-CM

## 2017-06-22 DIAGNOSIS — Z6841 Body Mass Index (BMI) 40.0 and over, adult: Secondary | ICD-10-CM | POA: Diagnosis not present

## 2017-06-22 DIAGNOSIS — Z369 Encounter for antenatal screening, unspecified: Secondary | ICD-10-CM

## 2017-06-22 DIAGNOSIS — Z3A31 31 weeks gestation of pregnancy: Secondary | ICD-10-CM | POA: Diagnosis not present

## 2017-06-22 DIAGNOSIS — O99213 Obesity complicating pregnancy, third trimester: Secondary | ICD-10-CM | POA: Diagnosis present

## 2017-06-22 DIAGNOSIS — O99210 Obesity complicating pregnancy, unspecified trimester: Secondary | ICD-10-CM

## 2017-06-22 DIAGNOSIS — O3660X0 Maternal care for excessive fetal growth, unspecified trimester, not applicable or unspecified: Secondary | ICD-10-CM | POA: Insufficient documentation

## 2017-07-20 ENCOUNTER — Ambulatory Visit: Payer: Medicaid Other

## 2017-07-23 ENCOUNTER — Ambulatory Visit
Admission: RE | Admit: 2017-07-23 | Discharge: 2017-07-23 | Disposition: A | Discharge status: Home / Self Care | Payer: Medicaid Other | Source: Ambulatory Visit | Attending: Obstetrics and Gynecology | Admitting: Obstetrics and Gynecology

## 2017-07-23 DIAGNOSIS — Z3A35 35 weeks gestation of pregnancy: Secondary | ICD-10-CM | POA: Insufficient documentation

## 2017-07-23 DIAGNOSIS — O99210 Obesity complicating pregnancy, unspecified trimester: Secondary | ICD-10-CM

## 2017-07-23 DIAGNOSIS — O3663X0 Maternal care for excessive fetal growth, third trimester, not applicable or unspecified: Secondary | ICD-10-CM

## 2017-07-23 DIAGNOSIS — Z369 Encounter for antenatal screening, unspecified: Secondary | ICD-10-CM

## 2017-07-23 DIAGNOSIS — O99213 Obesity complicating pregnancy, third trimester: Secondary | ICD-10-CM | POA: Insufficient documentation

## 2017-07-31 LAB — OB RESULTS CONSOLE GBS: GBS: NEGATIVE

## 2017-08-07 ENCOUNTER — Ambulatory Visit (INDEPENDENT_AMBULATORY_CARE_PROVIDER_SITE_OTHER): Payer: Medicaid Other | Admitting: Obstetrics and Gynecology

## 2017-08-07 DIAGNOSIS — O99323 Drug use complicating pregnancy, third trimester: Secondary | ICD-10-CM

## 2017-08-07 DIAGNOSIS — O99210 Obesity complicating pregnancy, unspecified trimester: Secondary | ICD-10-CM | POA: Diagnosis not present

## 2017-08-07 DIAGNOSIS — Z3A37 37 weeks gestation of pregnancy: Secondary | ICD-10-CM | POA: Diagnosis not present

## 2017-08-07 DIAGNOSIS — O3663X0 Maternal care for excessive fetal growth, third trimester, not applicable or unspecified: Secondary | ICD-10-CM

## 2017-08-07 DIAGNOSIS — O0993 Supervision of high risk pregnancy, unspecified, third trimester: Secondary | ICD-10-CM

## 2017-08-07 DIAGNOSIS — Z369 Encounter for antenatal screening, unspecified: Secondary | ICD-10-CM

## 2017-08-07 NOTE — Progress Notes (Signed)
  [facility] Samantha CorneaSCHEDULING Messiah N Rutkowski 09-27-1990 Medical record #: [id number] Phone #:   Home Phone [phone]  Mobile [phone]  Home Phone [phone]    Prenatal Provider:[facility] Delivering Group:[facility] Proposed admission date/time:08/17/16 Method of induction:Pitocin  Weight: There were no vitals filed for this visit. BMI There is no height or weight on file to calculate BMI.,". HIV Negative HSV Negative EDC Estimated Date of Delivery: 1/13/19based on:LMP  Gestational age on admission: 4259w1d Gravidity/parity:G4P2012  Cervix Score   0 1 2 3   Position Posterior Midposition Anterior   Consistency Firm Medium Soft   Effacement (%) 0-30 40-50 60-70 >80  Dilation (cm) Closed 1-2 3-4 >5  Baby's station -3 -2 -1 +1, +2   Bishop Score:6   Medical induction of labor  select indication(s) below Elective induction ?39 weeks multiparous patient ?39 weeks primiparous patient with Bishop score ?7 ?40 weeks primiparous patient   Medical Indications Adapted from ACOG Committee Opinion #[id number], "Medically Indicated Late Preterm and Early Term Deliveries," 2013.  PLACENTAL / UTERINE ISSUES FETAL ISSUES MATERNAL ISSUES  ? Placenta previa (36.0-37.6) ? Isoimmunization (37.0-38.6) ? Preeclampsia without severe features or gestational HTN (37.0)  ? Suspected accreta (34.0-35.6) ? Growth Restriction Mason Jim([place]) ? Preeclampsia with severe features (34.0)  ? Prior classical CD, uterine window, rupture (36.0-37.6) ? Isolated (38.0-39.6) ? Chronic HTN (38.0-39.6)  ? Prior myomectomy (37.0-38.6) ? Concurrent findings (34.0-37.6) ? Cholestasis (37.0)  ? Umbilical vein varix (37.0) ? Growth Restriction (Twins) ? Diabetes  ? Placental abruption (chronic) ? Di-Di Isolated (36.0-37.6) ? Pregestational, controlled (39.0)  OBSTETRIC ISSUES ? Di-Di concurrent findings (32.0-34.6) ? Pregestational, uncontrolled (37.0-39.0)  ? Postdates ? (41 weeks) ? Mo-Di  isolated (32.0-34.6) ? Pregestational, vascular compromise (37.0- 39.0)  ? PPROM (34.0) ? Multiple Gestation ? Gestational, diet controlled (40.0)  ? Hx of IUFD (39.0 weeks) ? Di-Di (38.0-38.6) ? Gestational, med controlled (39.0)  ? Polyhydramnios, mild/moderate; SDV 8-16 or AFI 25-35 (39.0) ? Mo-Di (36.0-37.6) ? Gestational, uncontrolled (38.0-39.0)  ? Oligohydramnios (36.0-37.6); MVP <2 cm  For indications not listed above, delivery recommendations from maternal-fetal medicine consultant occurred on: Date:  Provider Signature: Vena Austriandreas Nahsir Venezia Scheduled by:AMS Date:08/07/2017 11:04 AM   Call [phone] to finalize the induction date/time  [id number] (07/17)

## 2017-08-07 NOTE — Progress Notes (Signed)
Obstetric H&P   Chief Complaint: Delivery planning  Prenatal Care Provider: [facility]  History of Present Illness: 26 y.o. [id number] 9598w5d by 08/23/2017, by Last Menstrual Period equal to 8 week US presenting for delivery planning.  History of anxiety, polysubstance abuse (coccaine, ETOH), anxiety and depression as well as macorsomia (G1 9lbs 8oz).  She was also noted to have thickened NT scan with normal cell free fetal DNA testing.   Given history of macrosomia, history of polysubstance abuse is interested in elective IOL.  +FM, no LOF, no VB  pregnancy Problems (from 02/09/17 to present)    Problem Noted Resolved   Supervision of high risk pregnancy, antepartum, third trimester 08/09/2017 by Vena AustriaStaebler, Charlet Harr, MD No   Macrosomia affecting management of mother 06/22/2017 by Jimmey RalphLivingston, Elizabeth, MD No   Overview Signed 06/22/2017 12:24 PM by Jimmey RalphLivingston, Elizabeth, MD    06/22/17 - 79 th percentile  Prior 97th      Maternal obesity, antepartum 05/25/2017 by Kirby FunkEllestad, Sarah, MD No      Review of Systems: 10 point review of systems negative unless otherwise noted in HPI  Past Medical History: Past Medical History:  Diagnosis Date  . Asthma   . Back pain   . Migraine   . Sciatic leg pain     Past Surgical History: Past Surgical History:  Procedure Laterality Date  . ADENOIDECTOMY    . ADENOIDECTOMY    . MYRINGOTOMY WITH TUBE PLACEMENT    . TONSILLECTOMY      Family History: No family history on file.  Social History: Social History   Socioeconomic History  . Marital status: Married    Spouse name: Not on file  . Number of children: Not on file  . Years of education: Not on file  . Highest education level: Not on file  Social Needs  . Financial resource strain: Not on file  . Food insecurity - worry: Not on file  . Food insecurity - inability: Not on file  . Transportation needs - medical: Not on file  . Transportation needs - non-medical: Not on file    Occupational History  . Not on file  Tobacco Use  . Smoking status: Current Every Day Smoker    Types: Cigarettes  . Smokeless tobacco: Never Used  Substance and Sexual Activity  . Alcohol use: Yes    Comment: twice weekly/ not durning pregnancy  . Drug use: No  . Sexual activity: Yes  Other Topics Concern  . Not on file  Social History Narrative  . Not on file    Medications: Prior to Admission medications   Medication Sig Start Date End Date Taking? Authorizing Provider  albuterol (PROVENTIL HFA;VENTOLIN HFA) 108 (90 Base) MCG/ACT inhaler Inhale 2 puffs into the lungs every 6 (six) hours as needed for wheezing.    Yes [provider]  Prenatal Vit-Fe Fumarate-FA (PRENATAL MULTIVITAMIN) TABS tablet Take 1 tablet by mouth daily at 12 noon.   Yes [provider]  vitamin B-12 (CYANOCOBALAMIN) 1000 MCG tablet Take 1,000 mcg by mouth daily.   Yes [provider]  cyclobenzaprine (FLEXERIL) 10 MG tablet Take 1 tablet (10 mg total) by mouth 3 (three) times daily as needed for muscle spasms. Patient not taking: Reported on 02/09/2017 11/03/16   Kem Boroughsriplett, Cari B, FNP  naproxen (NAPROSYN) 500 MG tablet Take 1 tablet (500 mg total) by mouth 2 (two) times daily with a meal. Patient not taking: Reported on 02/09/2017 12/07/16   Cuthriell, Delorise RoyalsJonathan D, PA-C  Allergies: Allergies  Allergen Reactions  . Ceclor [Cefaclor] Hives  . Keflex [Cephalexin] Hives  . Latex Rash  . Omnicef [Cefdinir] Rash    Physical Exam: Last menstrual period 11/16/2016.   FHT 150  General: NAD HEENT: normocephalic, anicteric Pulmonary: No increased work of breathing Cardiovascular: RRR, distal pulses 2+ Abdomen: Gravid, non-tender Leopolds: vtx 8lbs Genitourinary: deferred Extremities: no edema, erythema, or tenderness Neurologic: Grossly intact Psychiatric: mood appropriate, affect full  Labs: No results found for this or any previous visit (from the past 24  hour(s)).  Assessment: 26 y.o. Z6X0960G4P2012 5434w5d by 08/23/2017, by Last Menstrual Period equal to 8 week US here to discuss delivery planning  Plan: 1) Delivery - discussed results of ARRIVE study.  [facility]  Proceed with IOL 08/17/16   2) Fetus - + FHT   3) PNL - A negative / ABSC neg / RI/ VZI / HBsAg neg / RPR NR / HIV neg / InformaSeq Negative / AFP negative / 1-hr 47 early and 77 at 28 weeks / GBS negative. - UDS x 2 this pregnancy   4) Immunization History - Influenza and TDAP 10/7  5) Disposition - pending delivery

## 2017-08-09 DIAGNOSIS — O0993 Supervision of high risk pregnancy, unspecified, third trimester: Secondary | ICD-10-CM | POA: Insufficient documentation

## 2017-08-09 DIAGNOSIS — O99323 Drug use complicating pregnancy, third trimester: Secondary | ICD-10-CM | POA: Insufficient documentation

## 2017-08-11 NOTE — L&D Delivery Note (Signed)
Date of delivery: 08/17/2017 Estimated Date of Delivery: 08/23/17 Patient's last menstrual period was 11/16/2016. EGA: 3528w1d  Delivery Note At 10:13 PM a viable female was delivered via Vaginal, Spontaneous (Presentation: OA;  LOA).  APGAR: 9, 9; weight: 3770 g.   Placenta status: spontaneous, intact.   Cord:  with the following complications: none.  Cord pH: NA  Called to see patient.  Mom pushed to deliver a viable female infant.  The head followed by shoulders, which delivered without difficulty, and the rest of the body.  No nuchal cord noted.  Baby to mom's chest.  Cord clamped and cut after 3 min delay.  Cord blood obtained.  Placenta delivered spontaneously, intact, with a 3-vessel cord.  Perineum intact and no other lacerations.  All counts correct.  Hemostasis obtained with IV pitocin and fundal massage.  Anesthesia:  Epidural Episiotomy: None Lacerations: None Suture Repair: NA Est. Blood Loss (mL): 200  Mom to postpartum.  Baby to Couplet care / Skin to Skin.  Samantha MallJane Koleen Celia, CNM 08/17/2017, 10:42 PM

## 2017-08-17 ENCOUNTER — Inpatient Hospital Stay: Payer: Medicaid Other | Admitting: Anesthesiology

## 2017-08-17 ENCOUNTER — Other Ambulatory Visit: Payer: Self-pay

## 2017-08-17 ENCOUNTER — Inpatient Hospital Stay
Admission: EM | Admit: 2017-08-17 | Discharge: 2017-08-19 | DRG: 807 | Disposition: A | Discharge status: Home / Self Care | Payer: Medicaid Other | Attending: Obstetrics and Gynecology | Admitting: Obstetrics and Gynecology

## 2017-08-17 DIAGNOSIS — O99344 Other mental disorders complicating childbirth: Secondary | ICD-10-CM | POA: Diagnosis present

## 2017-08-17 DIAGNOSIS — O3663X0 Maternal care for excessive fetal growth, third trimester, not applicable or unspecified: Principal | ICD-10-CM | POA: Diagnosis present

## 2017-08-17 DIAGNOSIS — D649 Anemia, unspecified: Secondary | ICD-10-CM | POA: Diagnosis present

## 2017-08-17 DIAGNOSIS — F329 Major depressive disorder, single episode, unspecified: Secondary | ICD-10-CM | POA: Diagnosis present

## 2017-08-17 DIAGNOSIS — F411 Generalized anxiety disorder: Secondary | ICD-10-CM | POA: Diagnosis present

## 2017-08-17 DIAGNOSIS — J069 Acute upper respiratory infection, unspecified: Secondary | ICD-10-CM | POA: Diagnosis present

## 2017-08-17 DIAGNOSIS — O99214 Obesity complicating childbirth: Secondary | ICD-10-CM | POA: Diagnosis present

## 2017-08-17 DIAGNOSIS — Z6791 Unspecified blood type, Rh negative: Secondary | ICD-10-CM | POA: Diagnosis not present

## 2017-08-17 DIAGNOSIS — O99210 Obesity complicating pregnancy, unspecified trimester: Secondary | ICD-10-CM

## 2017-08-17 DIAGNOSIS — O9952 Diseases of the respiratory system complicating childbirth: Secondary | ICD-10-CM | POA: Diagnosis present

## 2017-08-17 DIAGNOSIS — J45909 Unspecified asthma, uncomplicated: Secondary | ICD-10-CM | POA: Diagnosis present

## 2017-08-17 DIAGNOSIS — Z3A39 39 weeks gestation of pregnancy: Secondary | ICD-10-CM | POA: Diagnosis not present

## 2017-08-17 DIAGNOSIS — E669 Obesity, unspecified: Secondary | ICD-10-CM | POA: Diagnosis present

## 2017-08-17 DIAGNOSIS — O9902 Anemia complicating childbirth: Secondary | ICD-10-CM | POA: Diagnosis present

## 2017-08-17 DIAGNOSIS — O26893 Other specified pregnancy related conditions, third trimester: Secondary | ICD-10-CM | POA: Diagnosis present

## 2017-08-17 DIAGNOSIS — O99334 Smoking (tobacco) complicating childbirth: Secondary | ICD-10-CM | POA: Diagnosis present

## 2017-08-17 DIAGNOSIS — O99323 Drug use complicating pregnancy, third trimester: Secondary | ICD-10-CM

## 2017-08-17 DIAGNOSIS — F17210 Nicotine dependence, cigarettes, uncomplicated: Secondary | ICD-10-CM | POA: Diagnosis present

## 2017-08-17 DIAGNOSIS — O0993 Supervision of high risk pregnancy, unspecified, third trimester: Secondary | ICD-10-CM

## 2017-08-17 DIAGNOSIS — Z91040 Latex allergy status: Secondary | ICD-10-CM | POA: Diagnosis not present

## 2017-08-17 LAB — CBC
HEMATOCRIT: 34.7 % — AB (ref 35.0–47.0)
HEMOGLOBIN: 11.5 g/dL — AB (ref 12.0–16.0)
MCH: 29.7 pg (ref 26.0–34.0)
MCHC: 33.3 g/dL (ref 32.0–36.0)
MCV: 89.2 fL (ref 80.0–100.0)
Platelets: 180 10*3/uL (ref 150–440)
RBC: 3.89 MIL/uL (ref 3.80–5.20)
RDW: 14.2 % (ref 11.5–14.5)
WBC: 14 10*3/uL — ABNORMAL HIGH (ref 3.6–11.0)

## 2017-08-17 LAB — URINE DRUG SCREEN, QUALITATIVE (ARMC ONLY)
Amphetamines, Ur Screen: NOT DETECTED
BENZODIAZEPINE, UR SCRN: NOT DETECTED
Barbiturates, Ur Screen: NOT DETECTED
Cannabinoid 50 Ng, Ur ~~LOC~~: NOT DETECTED
Cocaine Metabolite,Ur ~~LOC~~: NOT DETECTED
MDMA (ECSTASY) UR SCREEN: NOT DETECTED
Methadone Scn, Ur: NOT DETECTED
OPIATE, UR SCREEN: NOT DETECTED
PHENCYCLIDINE (PCP) UR S: NOT DETECTED
Tricyclic, Ur Screen: NOT DETECTED

## 2017-08-17 LAB — ABO/RH: ABO/RH(D): A NEG

## 2017-08-17 MED ORDER — EPHEDRINE 5 MG/ML INJ
10.0000 mg | INTRAVENOUS | Status: DC | PRN
Start: 2017-08-17 — End: 2017-08-17

## 2017-08-17 MED ORDER — EPHEDRINE 5 MG/ML INJ
10.0000 mg | INTRAVENOUS | Status: DC | PRN
  Filled 2017-08-17: qty 2

## 2017-08-17 MED ORDER — PHENYLEPHRINE 40 MCG/ML (10ML) SYRINGE FOR IV PUSH (FOR BLOOD PRESSURE SUPPORT): 80.0000 ug | PREFILLED_SYRINGE | INTRAVENOUS | Status: DC | PRN

## 2017-08-17 MED ORDER — PHENYLEPHRINE 40 MCG/ML (10ML) SYRINGE FOR IV PUSH (FOR BLOOD PRESSURE SUPPORT)
80.0000 ug | PREFILLED_SYRINGE | INTRAVENOUS | Status: DC | PRN
  Filled 2017-08-17: qty 5

## 2017-08-17 MED ORDER — MISOPROSTOL 200 MCG PO TABS
ORAL_TABLET | ORAL | Status: AC
  Filled 2017-08-17: qty 4

## 2017-08-17 MED ORDER — LACTATED RINGERS IV SOLN
INTRAVENOUS | Status: DC
  Administered 2017-08-17 (×3): via INTRAVENOUS

## 2017-08-17 MED ORDER — OXYTOCIN 10 UNIT/ML IJ SOLN
INTRAMUSCULAR | Status: AC
  Filled 2017-08-17: qty 2

## 2017-08-17 MED ORDER — BUTORPHANOL TARTRATE 1 MG/ML IJ SOLN
1.0000 mg | INTRAMUSCULAR | Status: DC | PRN
  Administered 2017-08-17: 1 mg via INTRAVENOUS
  Filled 2017-08-17: qty 1

## 2017-08-17 MED ORDER — BUPIVACAINE HCL (PF) 0.25 % IJ SOLN
INTRAMUSCULAR | Status: DC | PRN
  Administered 2017-08-17: 10 mL via EPIDURAL

## 2017-08-17 MED ORDER — LACTATED RINGERS IV SOLN: 500.0000 mL | Freq: Once | INTRAVENOUS | Status: DC

## 2017-08-17 MED ORDER — LACTATED RINGERS IV SOLN
500.0000 mL | INTRAVENOUS | Status: DC | PRN
  Administered 2017-08-17: 500 mL via INTRAVENOUS

## 2017-08-17 MED ORDER — LIDOCAINE HCL (PF) 1 % IJ SOLN
INTRAMUSCULAR | Status: AC
  Filled 2017-08-17: qty 30

## 2017-08-17 MED ORDER — FENTANYL 2.5 MCG/ML W/ROPIVACAINE 0.15% IN NS 100 ML EPIDURAL (ARMC): EPIDURAL | Status: DC | PRN

## 2017-08-17 MED ORDER — DIPHENHYDRAMINE HCL 50 MG/ML IJ SOLN: 12.5000 mg | INTRAMUSCULAR | Status: DC | PRN

## 2017-08-17 MED ORDER — OXYTOCIN BOLUS FROM INFUSION: 500.0000 mL | Freq: Once | INTRAVENOUS | Status: DC

## 2017-08-17 MED ORDER — LIDOCAINE HCL (PF) 1 % IJ SOLN
30.0000 mL | INTRAMUSCULAR | Status: AC | PRN
  Administered 2017-08-17: 3 mL via SUBCUTANEOUS

## 2017-08-17 MED ORDER — EPHEDRINE 5 MG/ML INJ: 10.0000 mg | INTRAVENOUS | Status: DC | PRN

## 2017-08-17 MED ORDER — OXYTOCIN 40 UNITS IN LACTATED RINGERS INFUSION - SIMPLE MED
1.0000 m[IU]/min | INTRAVENOUS | Status: DC
  Administered 2017-08-17: 2 m[IU]/min via INTRAVENOUS

## 2017-08-17 MED ORDER — OXYTOCIN 40 UNITS IN LACTATED RINGERS INFUSION - SIMPLE MED
2.5000 [IU]/h | INTRAVENOUS | Status: DC
  Filled 2017-08-17: qty 1000

## 2017-08-17 MED ORDER — ACETAMINOPHEN 325 MG PO TABS: 650.0000 mg | ORAL_TABLET | ORAL | Status: DC | PRN

## 2017-08-17 MED ORDER — LIDOCAINE-EPINEPHRINE (PF) 1.5 %-1:200000 IJ SOLN: INTRAMUSCULAR | Status: DC | PRN

## 2017-08-17 MED ORDER — FENTANYL 2.5 MCG/ML W/ROPIVACAINE 0.15% IN NS 100 ML EPIDURAL (ARMC)
12.0000 mL/h | EPIDURAL | Status: DC
  Administered 2017-08-17: 12 mL/h via EPIDURAL
  Filled 2017-08-17: qty 100

## 2017-08-17 MED ORDER — TERBUTALINE SULFATE 1 MG/ML IJ SOLN: 0.2500 mg | Freq: Once | INTRAMUSCULAR | Status: DC | PRN

## 2017-08-17 MED ORDER — GUAIFENESIN 100 MG/5ML PO SOLN
5.0000 mL | ORAL | Status: DC | PRN
  Administered 2017-08-17 – 2017-08-18 (×2): 100 mg via ORAL
  Filled 2017-08-17 (×4): qty 5

## 2017-08-17 MED ORDER — ONDANSETRON HCL 4 MG/2ML IJ SOLN: 4.0000 mg | Freq: Four times a day (QID) | INTRAMUSCULAR | Status: DC | PRN

## 2017-08-17 MED ORDER — FENTANYL 2.5 MCG/ML W/ROPIVACAINE 0.15% IN NS 100 ML EPIDURAL (ARMC): 12.0000 mL/h | EPIDURAL | Status: DC

## 2017-08-17 MED ORDER — AMMONIA AROMATIC IN INHA
RESPIRATORY_TRACT | Status: AC
  Filled 2017-08-17: qty 10

## 2017-08-17 NOTE — H&P (Signed)
OB History & Physical   History of Present Illness:  Chief Complaint: "I'm here for my induction. My last baby was 73 and a half pounds."  HPI:  Samantha Mendez is a 27 y.o. [phone] female at [redacted]w[redacted]d dated by LMP.  Her pregnancy has been complicated by history of macrosomal infant, cigarette smoker, obesity, mood disorder, generalized anxiety disorder with panic attacks, major depression, alcohol use in pregnancy prior to knowledge of pregnancy, abnormal 1st screen with negative Informaseq, Rh negative, asthma.    She reports occasional contractions.   She denies leakage of fluid.   She denies vaginal bleeding.   She reports fetal movement.    Maternal Medical History:   Past Medical History:  Diagnosis Date  . Asthma   . Back pain   . Migraine   . Sciatic leg pain     Past Surgical History:  Procedure Laterality Date  . ADENOIDECTOMY    . ADENOIDECTOMY    . MYRINGOTOMY WITH TUBE PLACEMENT    . TONSILLECTOMY      Allergies  Allergen Reactions  . Ceclor [Cefaclor] Hives  . Keflex [Cephalexin] Hives  . Latex Rash  . Omnicef [Cefdinir] Rash    Prior to Admission medications   Medication Sig Start Date End Date Taking? Authorizing Provider  acetaminophen (TYLENOL) 160 MG/5ML elixir Take 15 mg/kg by mouth every 4 (four) hours as needed for fever.    [provider]  albuterol (PROVENTIL HFA;VENTOLIN HFA) 108 (90 Base) MCG/ACT inhaler Inhale 2 puffs into the lungs every 6 (six) hours as needed for wheezing.     [provider]  cyclobenzaprine (FLEXERIL) 10 MG tablet Take 1 tablet (10 mg total) by mouth 3 (three) times daily as needed for muscle spasms. Patient not taking: Reported on 02/09/2017 11/03/16   Kem Boroughs B, FNP  naproxen (NAPROSYN) 500 MG tablet Take 1 tablet (500 mg total) by mouth 2 (two) times daily with a meal. Patient not taking: Reported on 02/09/2017 12/07/16   Cuthriell, Delorise Royals, PA-C  Prenatal Vit-Fe Fumarate-FA (PRENATAL MULTIVITAMIN) TABS  tablet Take 1 tablet by mouth daily at 12 noon.    [provider]  vitamin B-12 (CYANOCOBALAMIN) 1000 MCG tablet Take 1,000 mcg by mouth daily.    [provider]    OB History  Gravida Para Term Preterm AB Living  4 2 2  0 1 2  SAB TAB Ectopic Multiple Live Births  1 0 0 0      # Outcome Date GA Lbr Len/2nd Weight Sex Delivery Anes PTL Lv  4 Current           3 SAB           2 Term           1 Term               Prenatal care site: [facility]  Social History: She  reports that she has been smoking cigarettes.  She has been smoking about 0.50 packs per day. she has never used smokeless tobacco. She reports that she drinks alcohol. She reports that she does not use drugs.  Family History:   Mother with congestive heart failure She denies a family history of gynecologic cancers  Review of Systems: Negative x 10 systems reviewed except as noted in the HPI.    Physical Exam:  Vital Signs: BP (!) 120/59 (BP Location: Left Arm)   Pulse 86   Temp 97.7 F (36.5 C) (Oral)  Resp 18   Ht 5\' 5"  (1.651 m)   Wt 261 lb (118.4 kg)   LMP 11/16/2016   BMI 43.43 kg/m  Constitutional: Well nourished, well developed female in no acute distress.  HEENT: normal Skin: Warm and dry.  Cardiovascular: Regular rate and rhythm.   Extremity: trace edema  Respiratory: Clear to auscultation bilateral. Normal respiratory effort Abdomen: FHT present Back: no CVAT Neuro: DTRs 2+, Cranial nerves grossly intact Psych: Alert and Oriented x3. No memory deficits. Normal mood and affect.  MS: normal gait, normal bilateral lower extremity ROM/strength/stability.  Pelvic exam: (female chaperone present) is not limited by body habitus EGBUS: within normal limits Vagina: within normal limits and with normal mucosa  Cervix: sweep done, stretchy to 4/60/-2   Pertinent Results:  Prenatal Labs: Blood type/Rh A negative  Antibody screen negative  Rubella Immune  Varicella Immune    RPR  Non-reactive  HBsAg negative  HIV negative  GC negative  Chlamydia negative  Genetic screening 1st abnormal with negative follow up, AFP negative  1 hour GTT 77  3 hour GTT NA  GBS negative on 07/31/2017   Baseline FHR: 140 beats/min   Variability: moderate   Accelerations: present   Decelerations: absent Contractions: present frequency: occasional Overall assessment: Category I    Assessment:  Samantha Mendez is a 27 y.o. [phone] 9284G4P2012 female at 3237w1d with admission for induction of labor.   Plan:  1. Admit to Labor & Delivery  2. CBC, T&S, Clrs, IVF 3. GBS negative.   4. Fetal well-being: Category I 5. Start induction with Pitocin  Tresea MallJane Bayyinah Dukeman, CNM 08/17/2017 9:48 AM

## 2017-08-17 NOTE — Progress Notes (Signed)
  Labor Progress Note   27 y.o. Z6X0960G4P2012 @ 7860w1d , admitted for  Pregnancy, Labor Management. IOL  Subjective:  Patient is comfortable with epidural  Objective:  BP 116/67   Pulse 60   Temp 98.1 F (36.7 C)   Resp 18   Ht 5\' 5"  (1.651 m)   Wt 263 lb 3.2 oz (119.4 kg)   LMP 11/16/2016   SpO2 96%   BMI 43.80 kg/m  Abd: mild Extr: trace to 1+ bilateral pedal edema SVE: CERVIX: 6.5-7 cm dilated, 90 effaced, -1 station AROM for large amount of clear fluid  EFM: FHR: 150 bpm, variability: moderate,  accelerations:  Present,  decelerations:  Absent Toco: Frequency: Every 3 minutes Labs: I have reviewed the patient's lab results.   Assessment & Plan:  A5W0981G4P2012 @ 460w1d, admitted for  Pregnancy and Labor/Delivery Management  1. Pain management: epidural. 2. FWB: FHT category I.  3. ID: GBS negative 4. Labor management: continue with pitocin  All discussed with patient, see orders  Tresea MallJane Keithon Mccoin, CNM [facility], [facility] 08/17/2017  8:48 PM

## 2017-08-17 NOTE — Plan of Care (Signed)
  Progressing Education: Knowledge of General Education information will improve 08/17/2017 2317 - Progressing by Candyce ChurnNorris, Natayla Cadenhead, RN Health Behavior/Discharge Planning: Ability to manage health-related needs will improve 08/17/2017 2317 - Progressing by Candyce ChurnNorris, Trexton Escamilla, RN Clinical Measurements: Ability to maintain clinical measurements within normal limits will improve 08/17/2017 2317 - Progressing by Candyce ChurnNorris, Berdell Hostetler, RN Will remain free from infection 08/17/2017 2317 - Progressing by Candyce ChurnNorris, Amena Dockham, RN Diagnostic test results will improve 08/17/2017 2317 - Progressing by Candyce ChurnNorris, Latonda Larrivee, RN Respiratory complications will improve 08/17/2017 2317 - Progressing by Candyce ChurnNorris, Kelyn Ponciano, RN Cardiovascular complication will be avoided 08/17/2017 2317 - Progressing by Candyce ChurnNorris, Opha Mcghee, RN Activity: Risk for activity intolerance will decrease 08/17/2017 2317 - Progressing by Candyce ChurnNorris, Gustavia Carie, RN Nutrition: Adequate nutrition will be maintained 08/17/2017 2317 - Progressing by Candyce ChurnNorris, Daiton Cowles, RN Coping: Level of anxiety will decrease 08/17/2017 2317 - Progressing by Candyce ChurnNorris, Laquetta Racey, RN Elimination: Will not experience complications related to bowel motility 08/17/2017 2317 - Progressing by Candyce ChurnNorris, Miraya Cudney, RN Will not experience complications related to urinary retention 08/17/2017 2317 - Progressing by Candyce ChurnNorris, Florice Hindle, RN Pain Managment: General experience of comfort will improve 08/17/2017 2317 - Progressing by Candyce ChurnNorris, Johathon Overturf, RN Safety: Ability to remain free from injury will improve 08/17/2017 2317 - Progressing by Candyce ChurnNorris, Sharhonda Atwood, RN Skin Integrity: Risk for impaired skin integrity will decrease 08/17/2017 2317 - Progressing by Candyce ChurnNorris, Oneka Parada, RN

## 2017-08-17 NOTE — Anesthesia Procedure Notes (Deleted)
Epidural Patient location during procedure: OB Start time: 08/17/2017 11:50 AM End time: 08/17/2017 12:24 PM  Staffing Performed: anesthesiologist   Preanesthetic Checklist Completed: patient identified, site marked, surgical consent, pre-op evaluation, timeout performed, IV checked, risks and benefits discussed and monitors and equipment checked  Epidural Patient position: sitting Prep: Betadine Patient monitoring: heart rate, continuous pulse ox and blood pressure Approach: midline Location: L4-L5 Injection technique: LOR saline  Needle:  Needle type: Tuohy  Needle gauge: 17 G Needle length: 9 cm and 9 Needle insertion depth: 8 cm Catheter type: closed end flexible Catheter size: 20 Guage Catheter at skin depth: 13 cm Test dose: negative and 1.5% lidocaine with Epi 1:200 K  Assessment Events: blood not aspirated, injection not painful, no injection resistance, negative IV test and no paresthesia  Additional Notes   Patient tolerated the insertion well without complications.Reason for block:procedure for pain

## 2017-08-17 NOTE — Anesthesia Preprocedure Evaluation (Signed)
Anesthesia Evaluation  Patient identified by MRN, date of birth, ID band Patient awake    Reviewed: Allergy & Precautions, NPO status , Patient's Chart, lab work & pertinent test results  Airway Mallampati: II  TM Distance: >3 FB     Dental no notable dental hx.    Pulmonary asthma , Current Smoker,    Pulmonary exam normal        Cardiovascular negative cardio ROS Normal cardiovascular exam     Neuro/Psych  Headaches,  Neuromuscular disease negative psych ROS   GI/Hepatic negative GI ROS, Neg liver ROS,   Endo/Other  Morbid obesity  Renal/GU negative Renal ROS  negative genitourinary   Musculoskeletal negative musculoskeletal ROS (+)   Abdominal Normal abdominal exam  (+)   Peds negative pediatric ROS (+)  Hematology negative hematology ROS (+)   Anesthesia Other Findings   Reproductive/Obstetrics                             Anesthesia Physical Anesthesia Plan  ASA: II  Anesthesia Plan: Epidural   Post-op Pain Management:    Induction:   PONV Risk Score and Plan:   Airway Management Planned: Natural Airway  Additional Equipment:   Intra-op Plan:   Post-operative Plan:   Informed Consent:   Dental advisory given  Plan Discussed with: CRNA and Surgeon  Anesthesia Plan Comments:         Anesthesia Quick Evaluation

## 2017-08-17 NOTE — Discharge Summary (Signed)
OB Discharge Summary     Patient Name: Samantha Mendez DOB: 01/05/1991 MRN: [id number]  Date of admission: 08/17/2017 Delivering provider: Tresea MallJane Gledhill, CNM  Date of Delivery: 08/17/2017  Date of discharge: 08/19/2017  Admitting diagnosis: Induction for history of macrosomic infant Intrauterine pregnancy: 5918w1d     Secondary diagnosis: obesity, mood disorder, cigarette smoker, Rh negative, asthma     Discharge diagnosis: Term Pregnancy Delivered                                                                                                Post partum procedures: Rhogam  Augmentation: AROM and Pitocin  Complications: None  Hospital course:  Induction of Labor With Vaginal Delivery    27 y.o. yo [id number] at 4518w1d was admitted to the hospital 08/17/2017 for induction of labor.   Indication for induction: history of macrosomic infant.    Patient had an uncomplicated labor course as follows: Membrane Rupture Time/Date: 8:40 PM ,08/17/2017   Patient had delivery of viable female 10:13 PM, 08/17/2017 Details of delivery can be found in separate delivery note.    Patient had a routine postpartum course.  She is tolerating PO intake and pain with PO medication. She is ambulating and voiding without difficulty. Has had an URI with cough x 6 days and was discharged home on Mucinex DM. Patient is discharged home 08/19/2017.  Physical exam  Vitals:   08/18/17 1600 08/18/17 2013 08/18/17 2330 08/19/17 0833  BP: (!) 112/56 123/60 123/62 (!) 109/57  Pulse: 77 74 70 73  Resp: 18 18 18 18   Temp: 98 F (36.7 C) 98 F (36.7 C)    TempSrc: Oral Oral  Oral  SpO2: 98% 99% 100% 99%  Weight:      Height:       General: alert, cooperative and no distress. Coughing frequently Heart: RRR without murmur Lungs: CTAB Lochia: appropriate Uterine Fundus: firm/ U/ML/NT Incision: N/A DVT Evaluation: No evidence of DVT seen on physical exam.  Labs: Lab Results  Component Value Date   WBC 12.3 (H) 08/18/2017    HGB 10.9 (L) 08/18/2017   HCT 32.3 (L) 08/18/2017   MCV 90.0 08/18/2017   PLT 155 08/18/2017   A negative/ RI/ VI Discharge instruction: per After Visit Summary.  Medications:  Allergies as of 08/19/2017      Reactions   Ceclor [cefaclor] Hives   Keflex [cephalexin] Hives   Latex Rash   Omnicef [cefdinir] Rash      Medication List    STOP taking these medications   acetaminophen 160 MG/5ML elixir Commonly known as:  TYLENOL   cyclobenzaprine 10 MG tablet Commonly known as:  FLEXERIL   naproxen 500 MG tablet Commonly known as:  NAPROSYN   vitamin B-12 1000 MCG tablet Commonly known as:  CYANOCOBALAMIN     TAKE these medications   albuterol 108 (90 Base) MCG/ACT inhaler Commonly known as:  PROVENTIL HFA;VENTOLIN HFA Inhale 2 puffs into the lungs every 6 (six) hours as needed for wheezing.   ibuprofen 600 MG tablet Commonly known as:  ADVIL,MOTRIN Take 1 tablet (600 mg total)  by mouth every 6 (six) hours as needed for mild pain, moderate pain or cramping.   MUCINEX DM 30-600 MG Tb12 Take 1 tablet by mouth 2 (two) times daily as needed.   prenatal multivitamin Tabs tablet Take 1 tablet by mouth daily at 12 noon.       Diet: routine diet  Activity: Advance as tolerated. Pelvic rest for 6 weeks.   Outpatient follow up: Follow-up Information    Department, [facility]. Schedule an appointment as soon as possible for a visit in 6 week(s).   Why:  postpartum follow up visit Contact information: [address] [place] [address] [phone]             Postpartum contraception: IUD Rhogam Given postpartum: 08/19/2017. Baby A POSITIVE Rubella vaccine given postpartum: Rubella Immune Varicella vaccine given postpartum: Varicella Immune TDaP given antepartum or postpartum: given antepartum  Newborn Data: Live born female Dominic Birth Weight: 3770 g  APGAR: 9, 9  Newborn Delivery   Birth date/time:  08/17/2017  22:13:00 Delivery type:  Vaginal, Spontaneous      Baby Feeding: Breast  Disposition:home with mother  SIGNED:  Farrel Conners, CNM 08/19/2017 11:29 AM

## 2017-08-17 NOTE — Plan of Care (Signed)
  Progressing Education: Knowledge of General Education information will improve 08/17/2017 2317 - Progressing by Candyce ChurnNorris, Aarvi Stotts, RN Health Behavior/Discharge Planning: Ability to manage health-related needs will improve 08/17/2017 2317 - Progressing by Candyce ChurnNorris, Kelvis Berger, RN Clinical Measurements: Ability to maintain clinical measurements within normal limits will improve 08/17/2017 2317 - Progressing by Candyce ChurnNorris, Mallisa Alameda, RN Will remain free from infection 08/17/2017 2317 - Progressing by Candyce ChurnNorris, Edwena Mayorga, RN Diagnostic test results will improve 08/17/2017 2317 - Progressing by Candyce ChurnNorris, Carlye Panameno, RN Respiratory complications will improve 08/17/2017 2317 - Progressing by Candyce ChurnNorris, Elija Mccamish, RN Cardiovascular complication will be avoided 08/17/2017 2317 - Progressing by Candyce ChurnNorris, Kaevon Cotta, RN Activity: Risk for activity intolerance will decrease 08/17/2017 2317 - Progressing by Candyce ChurnNorris, Jahmal Dunavant, RN Nutrition: Adequate nutrition will be maintained 08/17/2017 2317 - Progressing by Candyce ChurnNorris, Aaryn Parrilla, RN Coping: Level of anxiety will decrease 08/17/2017 2317 - Progressing by Candyce ChurnNorris, Azelie Noguera, RN Elimination: Will not experience complications related to bowel motility 08/17/2017 2317 - Progressing by Candyce ChurnNorris, Crystalle Popwell, RN Will not experience complications related to urinary retention 08/17/2017 2317 - Progressing by Candyce ChurnNorris, Monserrath Junio, RN Pain Managment: General experience of comfort will improve 08/17/2017 2317 - Progressing by Candyce ChurnNorris, Caellum Mancil, RN Safety: Ability to remain free from injury will improve 08/17/2017 2317 - Progressing by Candyce ChurnNorris, Jeff Mccallum, RN Skin Integrity: Risk for impaired skin integrity will decrease 08/17/2017 2317 - Progressing by Candyce ChurnNorris, Charika Mikelson, RN Education: Knowledge of Childbirth will improve 08/17/2017 2318 - Progressing by Candyce ChurnNorris, Thersea Manfredonia, RN Ability to make informed decisions regarding treatment and plan of care will improve 08/17/2017 2318 - Progressing by Candyce ChurnNorris,  Brynli Ollis, RN Ability to state and carry out methods to decrease the pain will improve 08/17/2017 2318 - Progressing by Candyce ChurnNorris, Sharonann Malbrough, RN Coping: Ability to verbalize concerns and feelings about labor and delivery will improve 08/17/2017 2318 - Progressing by Candyce ChurnNorris, Delaina Fetsch, RN Life Cycle: Ability to make normal progression through stages of labor will improve 08/17/2017 2318 - Progressing by Candyce ChurnNorris, Kito Cuffe, RN Ability to effectively push during vaginal delivery will improve 08/17/2017 2318 - Progressing by Candyce ChurnNorris, Vega Stare, RN Role Relationship: Ability to demonstrate positive interaction with the child will improve 08/17/2017 2318 - Progressing by Candyce ChurnNorris, Zenaida Tesar, RN Safety: Risk of complications during labor and delivery will decrease 08/17/2017 2318 - Progressing by Candyce ChurnNorris, Nikkole Placzek, RN Pain Management: Relief or control of pain from uterine contractions will improve 08/17/2017 2318 - Progressing by Candyce ChurnNorris, Grayce Budden, RN

## 2017-08-17 NOTE — Anesthesia Procedure Notes (Signed)
Epidural Patient location during procedure: OB Start time: 08/17/2017 6:14 PM End time: 08/17/2017 6:26 PM  Staffing Anesthesiologist: Yves Dillarroll, Ethanjames Fontenot, MD Performed: anesthesiologist   Preanesthetic Checklist Completed: patient identified, site marked, surgical consent, pre-op evaluation, timeout performed, IV checked, risks and benefits discussed and monitors and equipment checked  Epidural Patient position: sitting Prep: Betadine Patient monitoring: heart rate, continuous pulse ox and blood pressure Approach: midline Location: L3-L4 Injection technique: LOR air  Needle:  Needle type: Tuohy  Needle gauge: 17 G Needle length: 9 cm and 9 Catheter type: closed end flexible Catheter size: 19 Gauge Test dose: negative and 1.5% lidocaine with Epi 1:200 K  Assessment Events: blood not aspirated, injection not painful, no injection resistance, negative IV test and no paresthesia  Additional Notes Time out called.  Patient placed in sitting position.  Back prepped and draped in sterile fashion.  A skin wheal was made in the L3_L4 interspace with 1% Lidocaine plain.  A 17G Tuohy needle was advanced into the epidural space by a loss of resistance technique.  No blood or paresthesias.  The catheter was advanced 4 cm.  The patient tolerated the procedure well.  The catheter was affixed to the back in sterile fashion.Reason for block:procedure for pain

## 2017-08-17 NOTE — Anesthesia Preprocedure Evaluation (Deleted)
Anesthesia Evaluation  Patient identified by MRN, date of birth, ID band Patient awake    Reviewed: Allergy & Precautions, NPO status , Patient's Chart, lab work & pertinent test results  History of Anesthesia Complications Negative for: history of anesthetic complications  Airway Mallampati: III       Dental   Pulmonary asthma , neg sleep apnea, neg COPD, Current Smoker,           Cardiovascular (-) hypertension(-) Past MI and (-) CHF (-) dysrhythmias (-) Valvular Problems/Murmurs     Neuro/Psych neg Seizures    GI/Hepatic Neg liver ROS, GERD (with pregnancy)  ,  Endo/Other  diabetes (gestational)  Renal/GU negative Renal ROS     Musculoskeletal   Abdominal   Peds  Hematology   Anesthesia Other Findings   Reproductive/Obstetrics                             Anesthesia Physical Anesthesia Plan  ASA: II  Anesthesia Plan: Epidural   Post-op Pain Management:    Induction:   PONV Risk Score and Plan:   Airway Management Planned:   Additional Equipment:   Intra-op Plan:   Post-operative Plan:   Informed Consent: I have reviewed the patients History and Physical, chart, labs and discussed the procedure including the risks, benefits and alternatives for the proposed anesthesia with the patient or authorized representative who has indicated his/her understanding and acceptance.     Plan Discussed with:   Anesthesia Plan Comments:         Anesthesia Quick Evaluation

## 2017-08-17 NOTE — Progress Notes (Signed)
  Labor Progress Note   27 y.o. [id number] @ 4769w1d , admitted for  Pregnancy, Labor Management. Induction for hx of macrosomic baby  Subjective:  Patient is actively working through contractions and is requesting epidural  Objective:  BP 120/67   Pulse 60   Temp 97.7 F (36.5 C) (Oral)   Resp 18   Ht 5\' 5"  (1.651 m)   Wt 263 lb 3.2 oz (119.4 kg)   LMP 11/16/2016   BMI 43.80 kg/m  Abd: mild Extr: trace to 1+ bilateral pedal edema SVE: CERVIX: 5.5-6 cm dilated, 80 effaced, -1 station  EFM: FHR: 145 bpm, variability: moderate,  accelerations:  Present,  decelerations:  Absent Toco: Frequency: Every 2-3 minutes Labs: I have reviewed the patient's lab results.   Assessment & Plan:  [id number] @ 6069w1d, admitted for  Pregnancy and Labor/Delivery Management  1. Pain management: had 1 dose of Stadol and is now ready for epidural. 2. FWB: FHT category I.  3. ID: GBS negative 4. Labor management: continue pitocin IOL  All discussed with patient, see orders  Tresea MallJane Jenasis Straley, CNM [facility], [facility] 08/17/2017  5:36 PM

## 2017-08-18 ENCOUNTER — Encounter: Payer: Self-pay | Admitting: Certified Nurse Midwife

## 2017-08-18 DIAGNOSIS — O99334 Smoking (tobacco) complicating childbirth: Secondary | ICD-10-CM

## 2017-08-18 DIAGNOSIS — J45909 Unspecified asthma, uncomplicated: Secondary | ICD-10-CM

## 2017-08-18 DIAGNOSIS — F17210 Nicotine dependence, cigarettes, uncomplicated: Secondary | ICD-10-CM

## 2017-08-18 DIAGNOSIS — O9952 Diseases of the respiratory system complicating childbirth: Secondary | ICD-10-CM

## 2017-08-18 LAB — RPR: RPR: NONREACTIVE

## 2017-08-18 LAB — CBC
HEMATOCRIT: 32.3 % — AB (ref 35.0–47.0)
HEMOGLOBIN: 10.9 g/dL — AB (ref 12.0–16.0)
MCH: 30.3 pg (ref 26.0–34.0)
MCHC: 33.7 g/dL (ref 32.0–36.0)
MCV: 90 fL (ref 80.0–100.0)
Platelets: 155 10*3/uL (ref 150–440)
RBC: 3.59 MIL/uL — ABNORMAL LOW (ref 3.80–5.20)
RDW: 14 % (ref 11.5–14.5)
WBC: 12.3 10*3/uL — ABNORMAL HIGH (ref 3.6–11.0)

## 2017-08-18 MED ORDER — ACETAMINOPHEN 325 MG PO TABS
650.0000 mg | ORAL_TABLET | ORAL | Status: DC | PRN
  Administered 2017-08-18 – 2017-08-19 (×4): 650 mg via ORAL
  Filled 2017-08-18 (×4): qty 2

## 2017-08-18 MED ORDER — SENNOSIDES-DOCUSATE SODIUM 8.6-50 MG PO TABS: 2.0000 | ORAL_TABLET | ORAL | Status: DC

## 2017-08-18 MED ORDER — PRENATAL MULTIVITAMIN CH
1.0000 | ORAL_TABLET | Freq: Every day | ORAL | Status: DC
  Administered 2017-08-18 – 2017-08-19 (×2): 1 via ORAL
  Filled 2017-08-18 (×2): qty 1

## 2017-08-18 MED ORDER — IBUPROFEN 600 MG PO TABS
600.0000 mg | ORAL_TABLET | Freq: Four times a day (QID) | ORAL | Status: DC
  Administered 2017-08-18 – 2017-08-19 (×7): 600 mg via ORAL
  Filled 2017-08-18 (×7): qty 1

## 2017-08-18 MED ORDER — WITCH HAZEL-GLYCERIN EX PADS: 1.0000 "application " | MEDICATED_PAD | CUTANEOUS | Status: DC | PRN

## 2017-08-18 MED ORDER — BENZOCAINE-MENTHOL 20-0.5 % EX AERO: 1.0000 "application " | INHALATION_SPRAY | CUTANEOUS | Status: DC | PRN

## 2017-08-18 MED ORDER — COCONUT OIL OIL: 1.0000 "application " | TOPICAL_OIL | Status: DC | PRN

## 2017-08-18 MED ORDER — GUAIFENESIN 100 MG/5ML PO SOLN
10.0000 mL | ORAL | Status: DC | PRN
  Administered 2017-08-18 (×3): 200 mg via ORAL
  Filled 2017-08-18 (×3): qty 10

## 2017-08-18 MED ORDER — ONDANSETRON HCL 4 MG/2ML IJ SOLN
4.0000 mg | INTRAMUSCULAR | Status: DC | PRN
Start: 2017-08-18 — End: 2017-08-19

## 2017-08-18 MED ORDER — DIPHENHYDRAMINE HCL 25 MG PO CAPS: 25.0000 mg | ORAL_CAPSULE | Freq: Four times a day (QID) | ORAL | Status: DC | PRN

## 2017-08-18 MED ORDER — ONDANSETRON HCL 4 MG PO TABS: 4.0000 mg | ORAL_TABLET | ORAL | Status: DC | PRN

## 2017-08-18 MED ORDER — DIBUCAINE 1 % RE OINT: 1.0000 "application " | TOPICAL_OINTMENT | RECTAL | Status: DC | PRN

## 2017-08-18 MED ORDER — SIMETHICONE 80 MG PO CHEW: 80.0000 mg | CHEWABLE_TABLET | ORAL | Status: DC | PRN

## 2017-08-18 NOTE — Plan of Care (Signed)
Patient making good progress toward expected goals

## 2017-08-18 NOTE — Progress Notes (Signed)
Post Partum Day 1 (12 hours pp) Subjective: no complaints, up ad lib, voiding, tolerating PO and pumping and bottle feeding  Objective: Blood pressure (!) 120/54, pulse 79, temperature 98 F (36.7 C), temperature source Oral, resp. rate 20, height 5\' 5"  (1.651 m), weight 119.4 kg (263 lb 3.2 oz), last menstrual period 11/16/2016, SpO2 100 %, unknown if currently breastfeeding.  Physical Exam:  General: alert, cooperative and no distress Lochia: appropriate Uterine Fundus: firm/ at U/ ML/ NT  DVT Evaluation: No evidence of DVT seen on physical exam.  Recent Labs    08/17/17 0949 08/18/17 0535  HGB 11.5* 10.9*  HCT 34.7* 32.3*  WBC 14.0* 12.3*  PLT 180 155   Information for the patient's newborn:  Samantha Mendez, Samantha Mendez [[id number]][[id number]]  A POS  Assessment/Plan: Stable PPD #1  A negative and baby A pos-needs Rhogam Mild anemia  Vitamin supplementation Plan discharge tomorrow Breast and bottle RI/VI/GBS negative TDAP and flu vaccine UTD Contraception: ?IUD   LOS: 1 day   Samantha Mendez Samantha Mendez 08/18/2017, 10:07 AM

## 2017-08-19 LAB — BPAM RBC
BLOOD PRODUCT EXPIRATION DATE: 201902032359
BLOOD PRODUCT EXPIRATION DATE: 201902032359
UNIT TYPE AND RH: 600
UNIT TYPE AND RH: 600

## 2017-08-19 LAB — TYPE AND SCREEN
ABO/RH(D): A NEG
ANTIBODY SCREEN: POSITIVE
UNIT DIVISION: 0
Unit division: 0

## 2017-08-19 LAB — FETAL SCREEN: FETAL SCREEN: NEGATIVE

## 2017-08-19 MED ORDER — GUAIFENESIN ER 600 MG PO TB12
600.0000 mg | ORAL_TABLET | Freq: Two times a day (BID) | ORAL | Status: DC
  Administered 2017-08-19: 600 mg via ORAL
  Filled 2017-08-19: qty 1

## 2017-08-19 MED ORDER — DEXTROMETHORPHAN POLISTIREX ER 30 MG/5ML PO SUER
30.0000 mg | Freq: Two times a day (BID) | ORAL | Status: DC
  Administered 2017-08-19: 30 mg via ORAL
  Filled 2017-08-19: qty 5

## 2017-08-19 MED ORDER — MUCINEX DM 30-600 MG PO TB12: 1.0000 | ORAL_TABLET | Freq: Two times a day (BID) | ORAL | 0 refills | Status: DC | PRN

## 2017-08-19 MED ORDER — DM-GUAIFENESIN ER 30-600 MG PO TB12: 1.0000 | ORAL_TABLET | Freq: Two times a day (BID) | ORAL | Status: DC

## 2017-08-19 MED ORDER — IBUPROFEN 600 MG PO TABS: 600.0000 mg | ORAL_TABLET | Freq: Four times a day (QID) | ORAL | 0 refills | Status: DC | PRN

## 2017-08-19 MED ORDER — RHO D IMMUNE GLOBULIN 1500 UNIT/2ML IJ SOSY
300.0000 ug | PREFILLED_SYRINGE | Freq: Once | INTRAMUSCULAR | Status: AC
  Administered 2017-08-19: 300 ug via INTRAMUSCULAR
  Filled 2017-08-19: qty 2

## 2017-08-19 NOTE — Progress Notes (Signed)
Both parents vied the DVD, "The Period of Purple Cry" prior to discharge home and understand all.

## 2017-08-19 NOTE — Discharge Instructions (Signed)
Vaginal Delivery, Care After °Refer to this sheet in the next few weeks. These instructions provide you with information about caring for yourself after vaginal delivery. Your health care provider may also give you more specific instructions. Your treatment has been planned according to current medical practices, but problems sometimes occur. Call your health care provider if you have any problems or questions. °What can I expect after the procedure? °After vaginal delivery, it is common to have: °· Some bleeding from your vagina. °· Soreness in your abdomen, your vagina, and the area of skin between your vaginal opening and your anus (perineum). °· Pelvic cramps. °· Fatigue. ° °Follow these instructions at home: °Medicines °· Take over-the-counter and prescription medicines only as told by your health care provider. °· If you were prescribed an antibiotic medicine, take it as told by your health care provider. Do not stop taking the antibiotic until it is finished. °Driving ° °· Do not drive or operate heavy machinery while taking prescription pain medicine. °· Do not drive for 24 hours if you received a sedative. °Lifestyle °· Do not drink alcohol. This is especially important if you are breastfeeding or taking medicine to relieve pain. °· Do not use tobacco products, including cigarettes, chewing tobacco, or e-cigarettes. If you need help quitting, ask your health care provider. °Eating and drinking °· Drink at least 8 eight-ounce glasses of water every day unless you are told not to by your health care provider. If you choose to breastfeed your baby, you may need to drink more water than this. °· Eat high-fiber foods every day. These foods may help prevent or relieve constipation. High-fiber foods include: °? Whole grain cereals and breads. °? Brown Everard. °? Beans. °? Fresh fruits and vegetables. °Activity °· Return to your normal activities as told by your health care provider. Ask your health care provider  what activities are safe for you. °· Rest as much as possible. Try to rest or take a nap when your baby is sleeping. °· Do not lift anything that is heavier than your baby or 10 lb (4.5 kg) until your health care provider says that it is safe. °· Talk with your health care provider about when you can engage in sexual activity. This may depend on your: °? Risk of infection. °? Rate of healing. °? Comfort and desire to engage in sexual activity. °Vaginal Care °· If you have an episiotomy or a vaginal tear, check the area every day for signs of infection. Check for: °? More redness, swelling, or pain. °? More fluid or blood. °? Warmth. °? Pus or a bad smell. °· Do not use tampons or douches until your health care provider says this is safe. °· Watch for any blood clots that may pass from your vagina. These may look like clumps of dark red, brown, or black discharge. °General instructions °· Keep your perineum clean and dry as told by your health care provider. °· Wear loose, comfortable clothing. °· Wipe from front to back when you use the toilet. °· Ask your health care provider if you can shower or take a bath. If you had an episiotomy or a perineal tear during labor and delivery, your health care provider may tell you not to take baths for a certain length of time. °· Wear a bra that supports your breasts and fits you well. °· If possible, have someone help you with household activities and help care for your baby for at least a few days after   you leave the hospital. °· Keep all follow-up visits for you and your baby as told by your health care provider. This is important. °Contact a health care provider if: °· You have: °? Vaginal discharge that has a bad smell. °? Difficulty urinating. °? Pain when urinating. °? A sudden increase or decrease in the frequency of your bowel movements. °? More redness, swelling, or pain around your episiotomy or vaginal tear. °? More fluid or blood coming from your episiotomy or  vaginal tear. °? Pus or a bad smell coming from your episiotomy or vaginal tear. °? A fever. °? A rash. °? Little or no interest in activities you used to enjoy. °? Questions about caring for yourself or your baby. °· Your episiotomy or vaginal tear feels warm to the touch. °· Your episiotomy or vaginal tear is separating or does not appear to be healing. °· Your breasts are painful, hard, or turn red. °· You feel unusually sad or worried. °· You feel nauseous or you vomit. °· You pass large blood clots from your vagina. If you pass a blood clot from your vagina, save it to show to your health care provider. Do not flush blood clots down the toilet without having your health care provider look at them. °· You urinate more than usual. °· You are dizzy or light-headed. °· You have not breastfed at all and you have not had a menstrual period for 12 weeks after delivery. °· You have stopped breastfeeding and you have not had a menstrual period for 12 weeks after you stopped breastfeeding. °Get help right away if: °· You have: °? Pain that does not go away or does not get better with medicine. °? Chest pain. °? Difficulty breathing. °? Blurred vision or spots in your vision. °? Thoughts about hurting yourself or your baby. °· You develop pain in your abdomen or in one of your legs. °· You develop a severe headache. °· You faint. °· You bleed from your vagina so much that you fill two sanitary pads in one hour. °This information is not intended to replace advice given to you by your health care provider. Make sure you discuss any questions you have with your health care provider. °Document Released: 07/25/2000 Document Revised: 01/09/2016 Document Reviewed: 08/12/2015 °Elsevier Interactive Patient Education © 2018 [employer] ° °

## 2017-08-19 NOTE — Progress Notes (Signed)
Parents given all discharge information and understand all.  Mother to make f/u appt. Breast pump kit given to pt prior to discharge home. Discharged home via wheelchair with baby by auxillary staff.

## 2017-08-19 NOTE — Anesthesia Postprocedure Evaluation (Signed)
Anesthesia Post Note  Patient: Samantha Mendez  Procedure(s) Performed: AN AD HOC LABOR EPIDURAL  Patient location during evaluation: Mother Baby Anesthesia Type: Epidural Level of consciousness: awake and alert Pain management: pain level controlled Vital Signs Assessment: post-procedure vital signs reviewed and stable Respiratory status: spontaneous breathing, nonlabored ventilation and respiratory function stable Cardiovascular status: stable Postop Assessment: no headache, no backache and epidural receding Anesthetic complications: no     Last Vitals:  Vitals:   08/18/17 2330 08/19/17 0833  BP: 123/62 (!) 109/57  Pulse: 70 73  Resp: 18 18  Temp:    SpO2: 100% 99%    Last Pain:  Vitals:   08/19/17 0833  TempSrc: Oral  PainSc:                  Jules SchickLogan,  Janiece Scovill P

## 2017-08-20 LAB — RHOGAM INJECTION: Unit division: 0

## 2017-12-06 IMAGING — US US MFM OB COMPLETE +14 WKS
1 series · 13 of 28 positions shown · non-contrast
Comparison: none

PATIENT INFO:

PERFORMED BY:
KANNOU
SERVICE(S) PROVIDED:
US MFM OB COMP LESS THAN 14 WEEKS                     76801.4
INDICATIONS:
12 weeks gestation of pregnancy
First trimester screen
FETAL EVALUATION:
Num Of Fetuses:     1
Fetal Heart         168
Rate(bpm):
Presentation:       Variable
Placenta:           Anterior
BIOMETRY:
CRL:      67.2  mm     G. Age:  13w 0d                  EDD:    08/17/17
GESTATIONAL AGE:
LMP:           12w 1d        Date:  11/16/16                 EDD:   08/23/17
Best:          12w 1d     Det. By:  LMP  (11/16/16)          EDD:   08/23/17
ANATOMY:
Choroid Plexus:        Within normal limits   Bladder:                Seen
for gestational age
Stomach:               Seen                   Upper Extremities:      Visualized
Abdominal Wall:        Within normal limits   Lower Extremities:      Visualized
CERVIX UTERUS ADNEXA:
Cervix
Length:           4.09  cm.
Right Ovary
Size(cm)       3.4  x   2.6    x  2.6       Vol(ml): 12
Corpus luteal cyst 1.9 X 1.7 X 1.7 cm

[Series 1: us mfm ob complete +14 wks · 0.26mm/px · 13 of 58 slices shown]
[im 3/58]
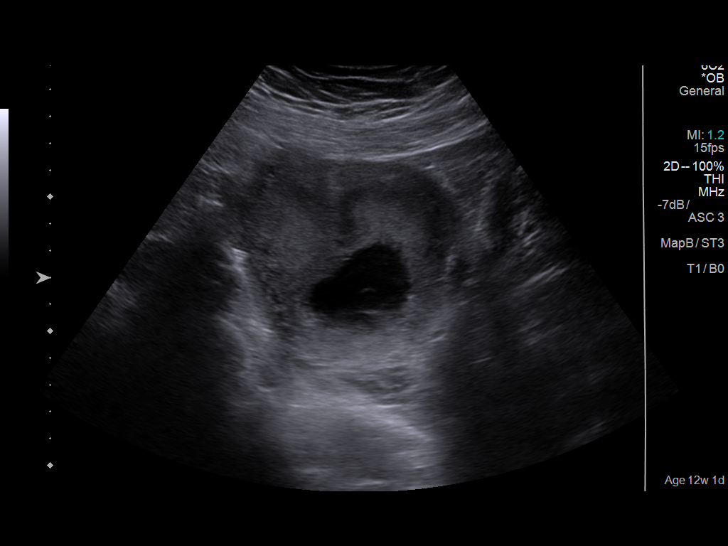
[im 7/58]
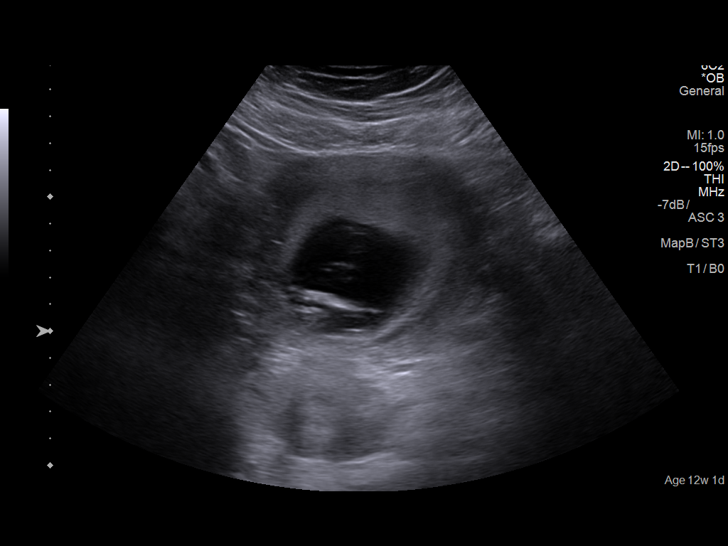
[im 11/58]
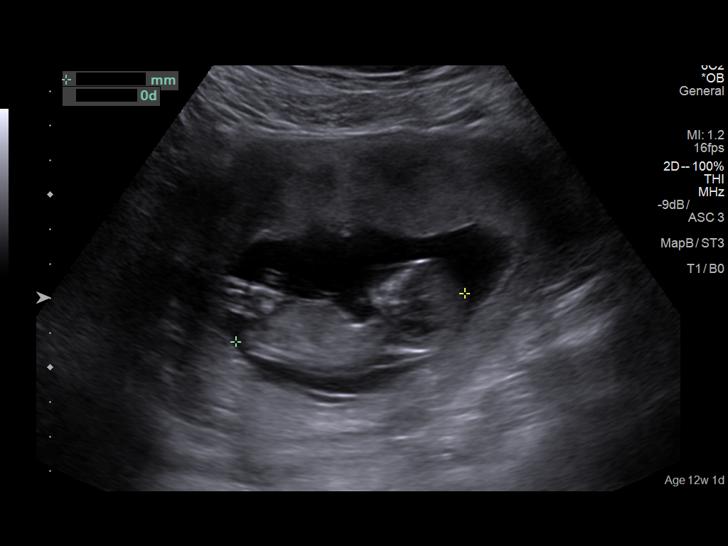
[im 15/58]
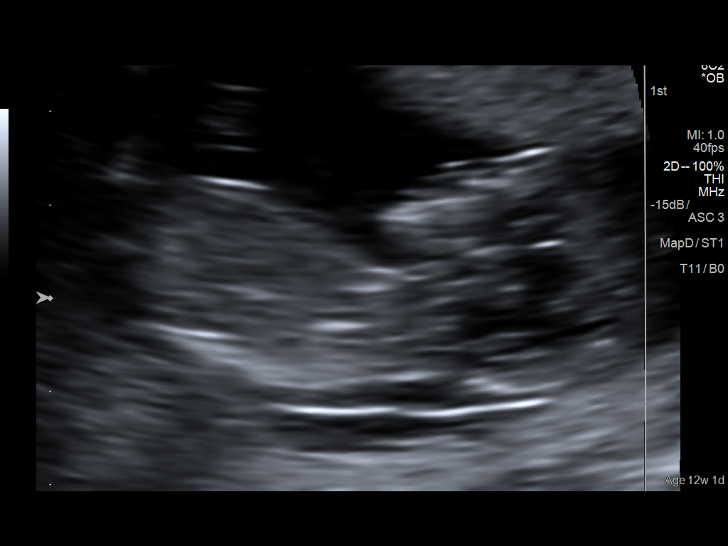
[im 20/58]
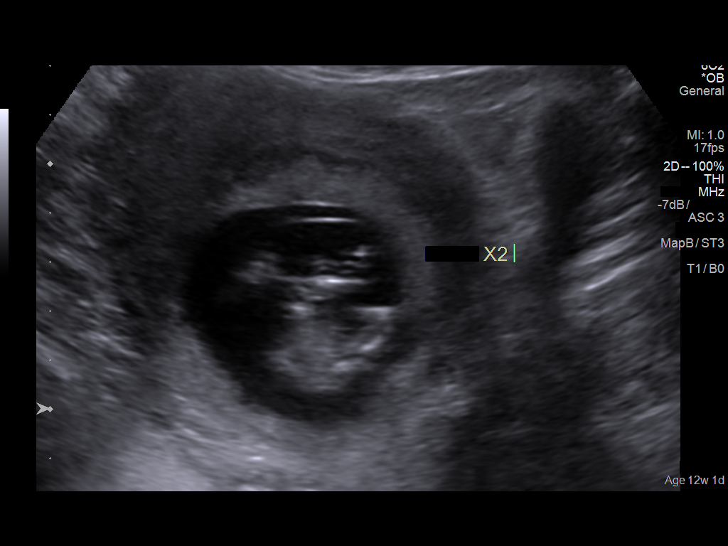
[im 24/58]
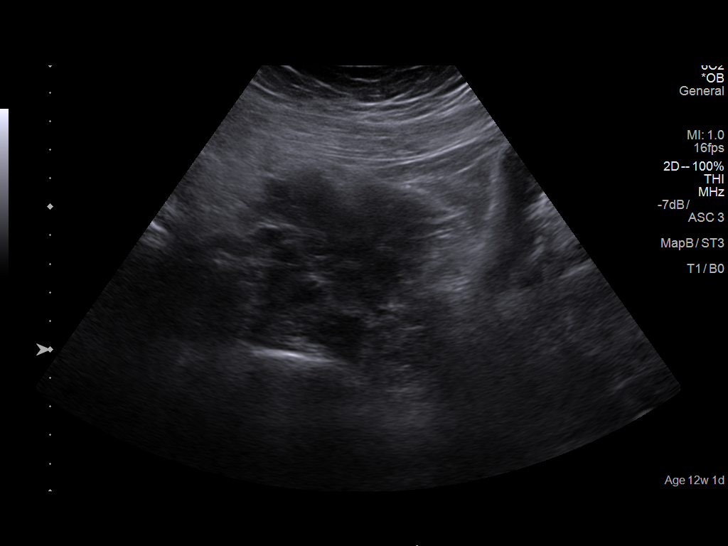
[im 30/58]
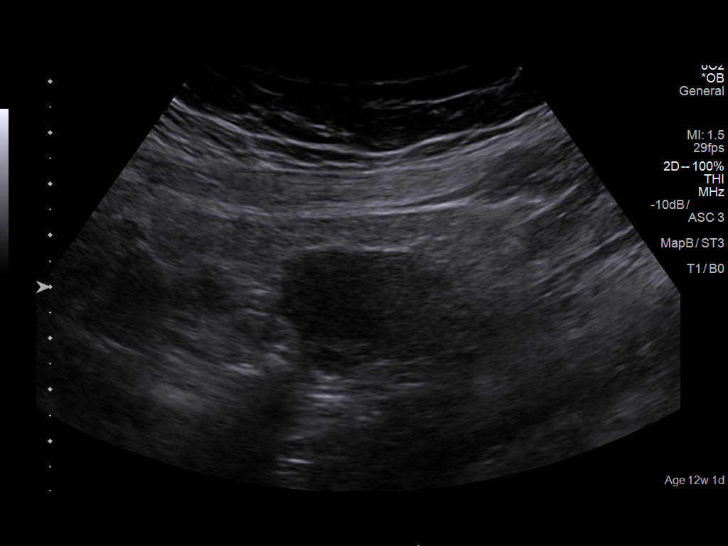
[im 34/58]
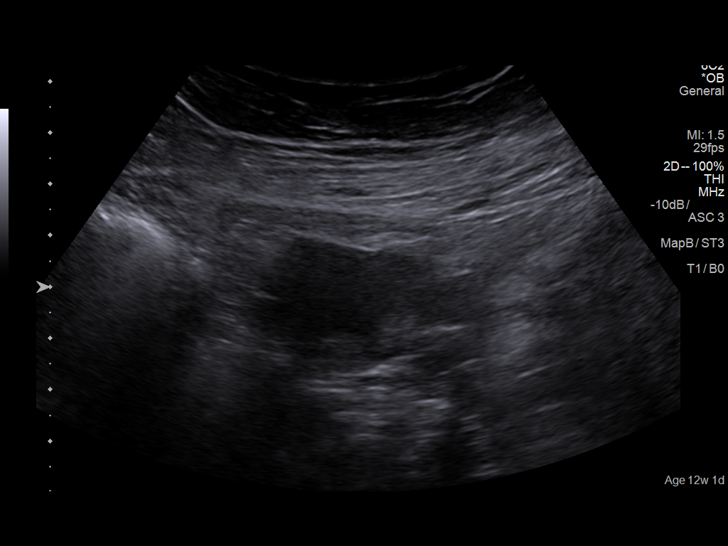
[im 39/58]
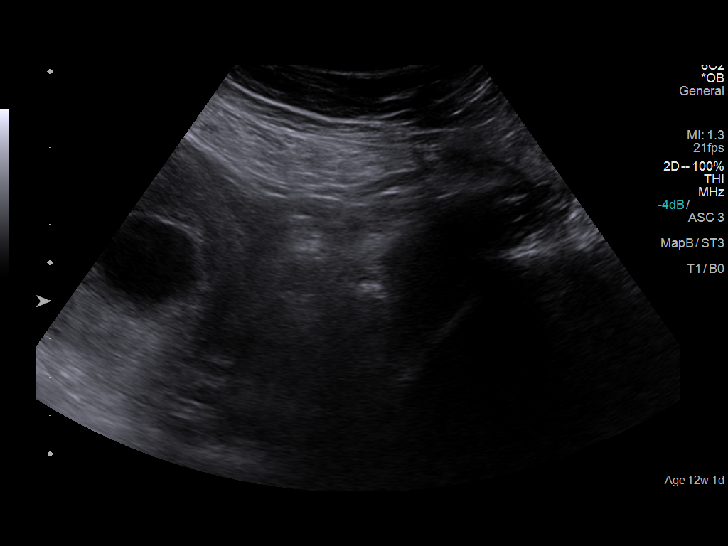
[im 43/58]
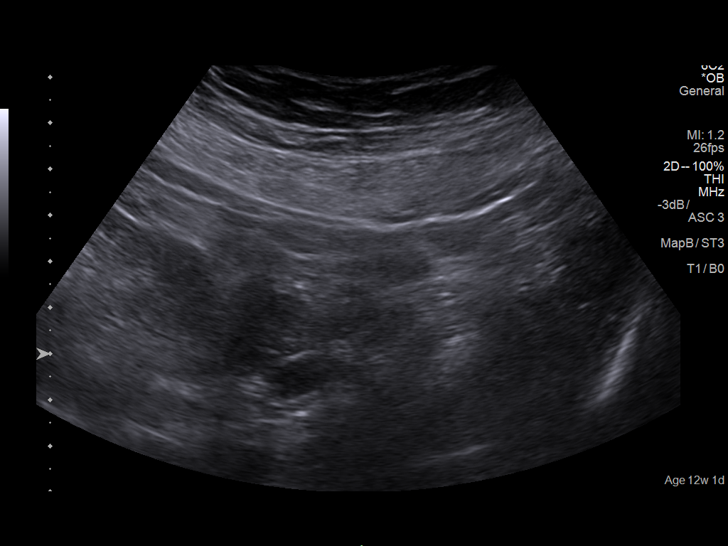
[im 47/58]
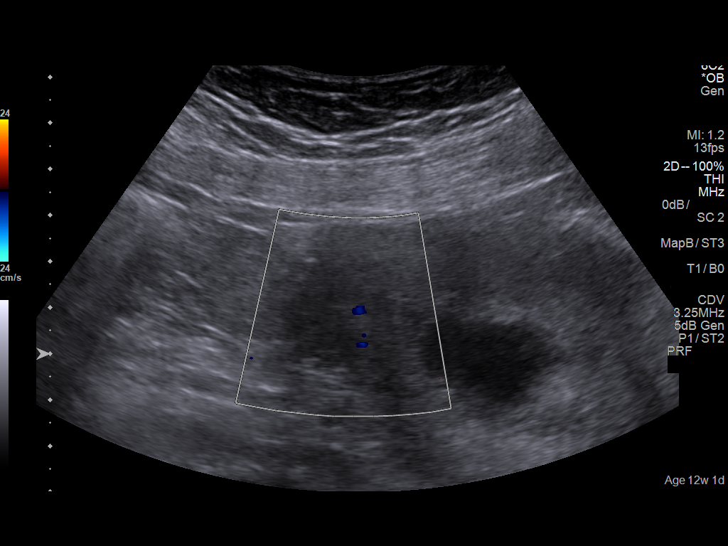
[im 51/58]
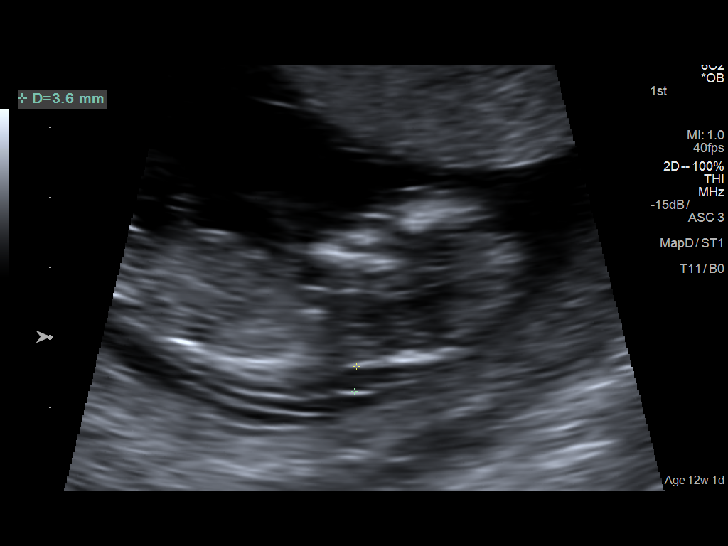
[im 55/58]
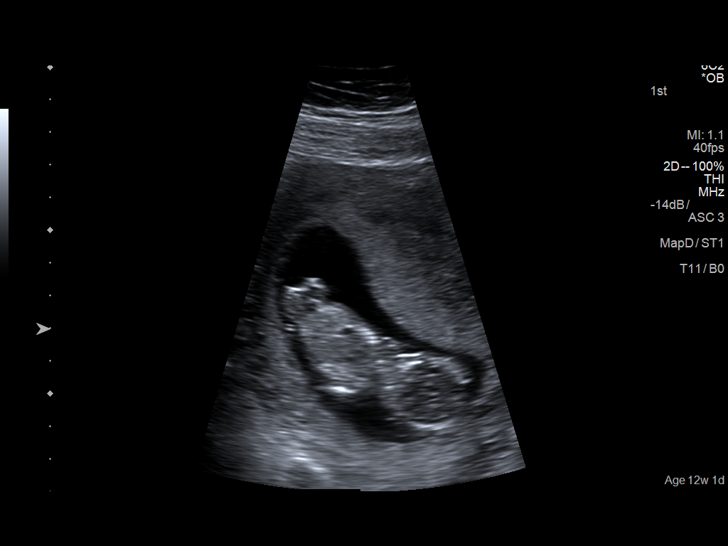

[13 of 28 positions shown; findings below may reference images not displayed]

IMPRESSION: Thank you for referring your patient for first trimester nuchal
translucency screening program.

A singleton gestation is noted.

The fetal biometry correlates with established dating.   She is
currently 12w 1day  by LMP and consistent with today's scan.

The nuchal translucency appeared thickened and measured
3.6 mm.

The fetal nasal bone was noted to be present.

The  right ovary was seen and the left was not..

I reviewed the findings with the patient and offered further
testing including cell free dna testing or consideration of
invasive testing.
I recommended a level 2 scan at 18 weeks and fetal echo at
22 weeks .
The genetic counselor met with the patient and cell free DNA
testing was performed.

Thank you for allowing us to participate in your patient's care.

assistance.

## 2018-03-07 ENCOUNTER — Emergency Department: Payer: Self-pay

## 2018-03-07 ENCOUNTER — Emergency Department
Admission: EM | Admit: 2018-03-07 | Discharge: 2018-03-07 | Disposition: A | Discharge status: Home / Self Care | Payer: Self-pay | Attending: Emergency Medicine | Admitting: Emergency Medicine

## 2018-03-07 ENCOUNTER — Encounter: Payer: Self-pay | Admitting: Emergency Medicine

## 2018-03-07 ENCOUNTER — Other Ambulatory Visit: Payer: Self-pay

## 2018-03-07 DIAGNOSIS — Z91040 Latex allergy status: Secondary | ICD-10-CM | POA: Insufficient documentation

## 2018-03-07 DIAGNOSIS — F17210 Nicotine dependence, cigarettes, uncomplicated: Secondary | ICD-10-CM | POA: Insufficient documentation

## 2018-03-07 DIAGNOSIS — Z79899 Other long term (current) drug therapy: Secondary | ICD-10-CM | POA: Insufficient documentation

## 2018-03-07 DIAGNOSIS — J4520 Mild intermittent asthma, uncomplicated: Secondary | ICD-10-CM | POA: Insufficient documentation

## 2018-03-07 DIAGNOSIS — M25571 Pain in right ankle and joints of right foot: Secondary | ICD-10-CM | POA: Insufficient documentation

## 2018-03-07 NOTE — ED Notes (Signed)
See triage note  Presents with pain to right ankle  States she felt a pop to ankle and pain radiates into heel  No swelling noted   Good pulses

## 2018-03-07 NOTE — ED Triage Notes (Signed)
Pt arrived via POV with reports of right ankle pain from injury during dancing last night. Pt states she feels like her ankle will roll out from underneath her when she is walking.

## 2018-03-07 NOTE — ED Provider Notes (Signed)
[facility] Emergency Department Provider Note ____________________________________________  Time seen: Approximately 4:48 PM  I have reviewed the triage vital signs and the nursing notes.   HISTORY  Chief Complaint Ankle Pain    HPI Samantha Mendez is a 27 y.o. female who presents to the emergency department for evaluation and treatment of right ankle pain after a twist injury last night while dancing. She states pain is worse with ambulation and she feels that her ankle is going to "roll again." No previous ankle fractures or injury. No alleviating measures attempted prior to arrival.  Past Medical History:  Diagnosis Date  . Asthma   . Back pain   . Migraine   . Sciatic leg pain     Patient Active Problem List   Diagnosis Date Noted  . Supervision of high risk pregnancy, antepartum, third trimester 08/09/2017  . Substance abuse affecting pregnancy in third trimester, antepartum 08/09/2017  . 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 Maternal obesity, antepartum 05/25/2017  . thick NT on first tri screen   . Mild intermittent asthma without complication 12/06/2014  . 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 ADENOIDECTOMY    . ADENOIDECTOMY    . MYRINGOTOMY WITH TUBE PLACEMENT    . TONSILLECTOMY      Prior to Admission medications   Medication Sig Start Date End Date Taking? Authorizing Provider  albuterol (PROVENTIL HFA;VENTOLIN HFA) 108 (90 Base) MCG/ACT inhaler Inhale 2 puffs into the lungs every 6 (six) hours as needed for wheezing.     [provider]  Dextromethorphan-Guaifenesin (MUCINEX DM) 30-600 MG TB12 Take 1 tablet by mouth 2 (two) times daily as needed. 08/19/17   Farrel Conners, CNM  ibuprofen (ADVIL,MOTRIN) 600 MG tablet Take 1 tablet (600 mg total) by mouth every 6 (six) hours as needed for mild pain, moderate pain or cramping. 08/19/17    Farrel Conners, CNM  Prenatal Vit-Fe Fumarate-FA (PRENATAL MULTIVITAMIN) TABS tablet Take 1 tablet by mouth daily at 12 noon.    [provider]    Allergies Ceclor [cefaclor]; Keflex [cephalexin]; Cephalosporins; Latex; Omnicef [cefdinir]; and Sulfa antibiotics  No family history on file.  Social History Social History   Tobacco Use  . Smoking status: Current Every Day Smoker    Packs/day: 0.50    Types: Cigarettes  . Smokeless tobacco: Never Used  Substance Use Topics  . Alcohol use: Yes    Comment: twice weekly/ last 5/18  . Drug use: No    Comment: last use July/18    Review of Systems Constitutional: Negative for fever. Cardiovascular: Negative for chest pain. Respiratory: Negative for shortness of breath. Musculoskeletal: Positive for right ankle pain. Skin: Negative for wound or lesion.  Neurological: Negative for decrease in sensation  ____________________________________________   PHYSICAL EXAM:  VITAL SIGNS: ED Triage Vitals  Enc Vitals Group     BP 03/07/18 1341 124/72     Pulse Rate 03/07/18 1341 75     Resp 03/07/18 1341 18     Temp 03/07/18 1341 98.2 F (36.8 C)     Temp Source 03/07/18 1341 Oral     SpO2 03/07/18 1341 99 %     Weight 03/07/18 1342 225 lb (102.1 kg)     Height 03/07/18 1342 5\' 5"  (1.651 m)     Head Circumference --      Peak Flow --      Pain Score  03/07/18 1348 8     Pain Loc --      Pain Edu? --      Excl. in GC? --     Constitutional: Alert and oriented. Well appearing and in no acute distress. Eyes: Conjunctivae are clear without discharge or drainage Head: Atraumatic Neck: Supple Respiratory: No cough. Respirations are even and unlabored. Musculoskeletal: Strength of the right ankle/foot 5/5 on resistance. ATFL pattern tenderness over the right ankle/foot.  Neurologic: Motor and sensation of the right foot/ankle intact.  Skin: No open wound or lesion.  Psychiatric: Affect and behavior are  appropriate.  ____________________________________________   LABS (all labs ordered are listed, but only abnormal results are displayed)  Labs Reviewed - No data to display ____________________________________________  RADIOLOGY  Image of the right ankle is negative for acute bony abnormality per radiology. ____________________________________________   PROCEDURES  Procedures  ____________________________________________   INITIAL IMPRESSION / ASSESSMENT AND PLAN / ED COURSE  Samantha Mendez is a 27 y.o. who presents to the emergency department for treatment and evaluation of right ankle pain after injuring it last night while dancing.  X-ray is reassuring.  She will be placed in an ankle stirrup splint and advised to rest, ice, and elevate the extremity while painful.  She is to follow-up with podiatry for symptoms that are not improving over the week.  She is to return to the emergency department for symptoms of concern if unable to see orthopedics or her primary care provider.  Medications - No data to display  Pertinent labs & imaging results that were available during my care of the patient were reviewed by me and considered in my medical decision making (see chart for details).  _________________________________________   FINAL CLINICAL IMPRESSION(S) / ED DIAGNOSES  Final diagnoses:  Acute right ankle pain    ED Discharge Orders    None       If controlled substance prescribed during this visit, 12 month history viewed on the [facility] prior to issuing an initial prescription for Schedule II or III opiod.    Chinita Pesterriplett, Currie Dennin B, FNP 03/07/18 1654    Arnaldo NatalMalinda, Paul F, MD 03/07/18 2120

## 2018-03-21 IMAGING — US US MFM OB FOLLOW-UP
1 series · 12 of 28 positions shown · non-contrast
Comparison: none

PATIENT INFO:

PERFORMED BY:
TUEN
SERVICE(S) PROVIDED:
INDICATIONS:
27 weeks gestation of pregnancy
Smoker
Morbid Obesity (BMI 42)
Increased nuchal translucency (first
trimester)
FETAL EVALUATION:
Num Of Fetuses:     1
Fetal Heart         153
Rate(bpm):
Fetal Lie:          Maternal Right
Presentation:       Cephalic
Placenta:           Anterior Grade 1, No previa
Amniotic Fluid
AFI FV:      Within normal limits
AFI Sum(cm)     %Tile       Largest Pocket(cm)
20.39           82
RUQ(cm)       RLQ(cm)       LUQ(cm)        LLQ(cm)
6.43
BIOMETRY:
BPD:      76.1  mm     G. Age:  30w 4d       > 99  %    CI:        74.71   %    70 - 86
FL/HC:      18.1   %    18.6 -
HC:      279.4  mm     G. Age:  30w 4d       > 97  %    HC/AC:      1.07        1.05 -
AC:      260.1  mm     G. Age:  30w 1d       > 97  %    FL/BPD:     66.4   %    71 - 87
FL:       50.5  mm     G. Age:  27w 1d         34  %    FL/AC:      19.4   %    20 - 24
HUM:      46.5  mm     G. Age:  27w 3d         52  %
Est. FW:    9589  gm           3 lb   > 97  %
GESTATIONAL AGE:
LMP:           27w 1d        Date:  11/16/16                 EDD:   08/23/17
U/S Today:     29w 4d                                        EDD:   08/06/17
Best:          27w 1d     Det. By:  LMP  (11/16/16)          EDD:   08/23/17
ANATOMY:
Cavum:                 Visualized             Stomach:                Seen
previously
Ventricles:            Normal appearance      Abdominal Wall:         Visualized
Cerebellum:            Visualized             Cord Vessels:           3 vessels,
previously                                     visualized previously
Posterior Fossa:       Visualized             Kidneys:                Normal appearance
Face:                  Orbits visualized      Bladder:                Seen
Lips:                  Visualized             Spine:                  Visualized
previously                                     previously
Heart:                 4-Chamber view         Upper Extremities:      Visualized
appears normal                                 previously
RVOT:                  Normal appearance      Lower Extremities:      Visualized
LVOT:                  Normal appearance

[Series 1: us mfm ob follow-up · 0.28mm/px · 12 of 61 slices shown]
[im 3/61]
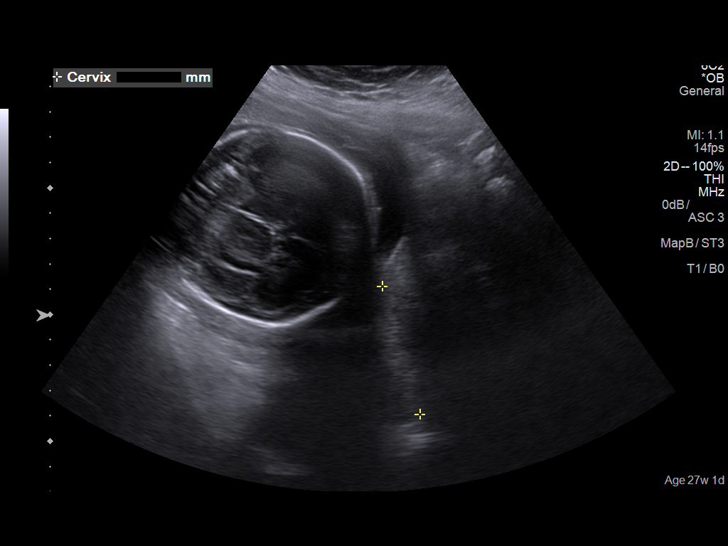
[im 7/61]
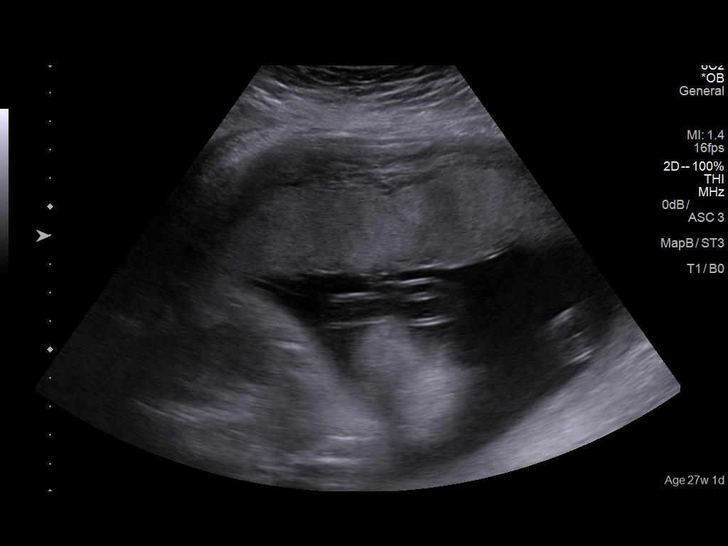
[im 12/61]
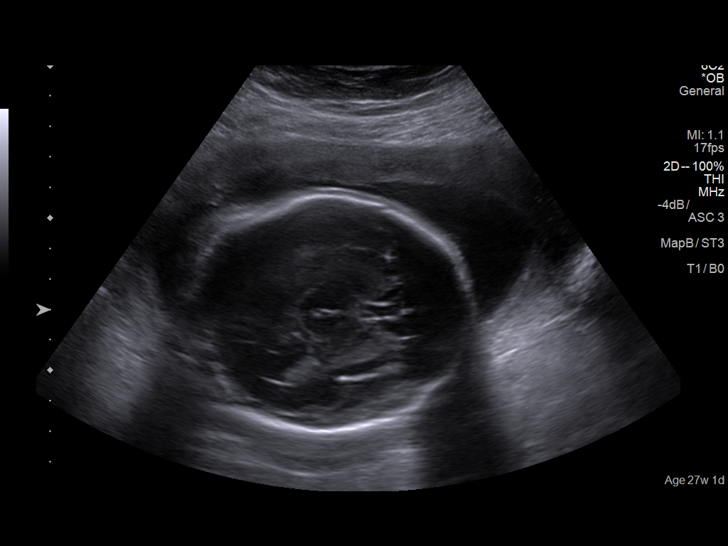
[im 18/61]
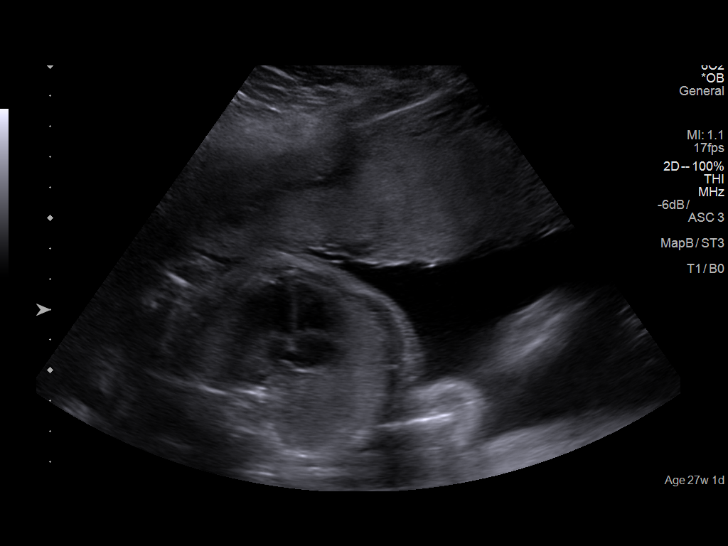
[im 23/61]
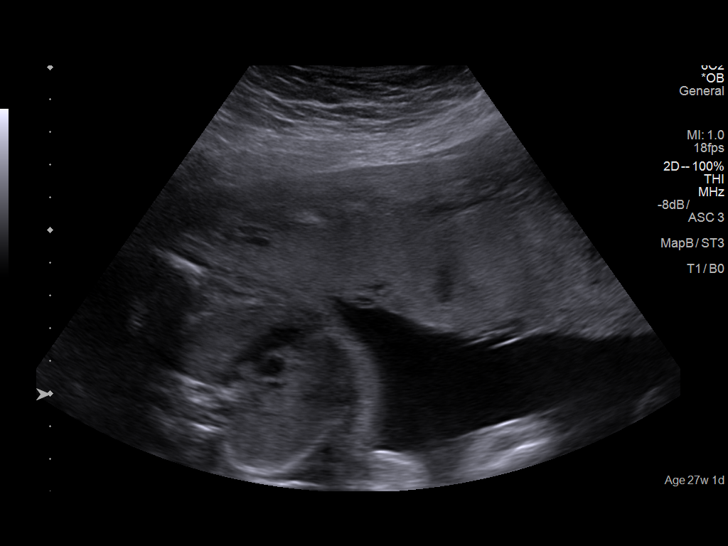
[im 27/61]
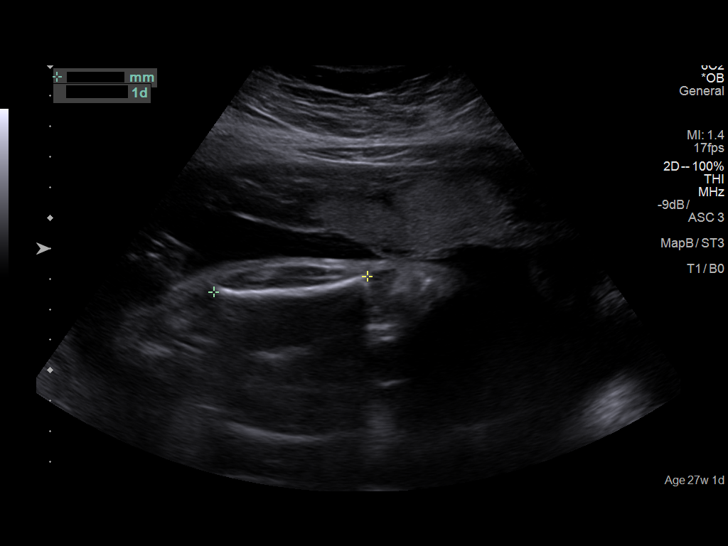
[im 34/61]
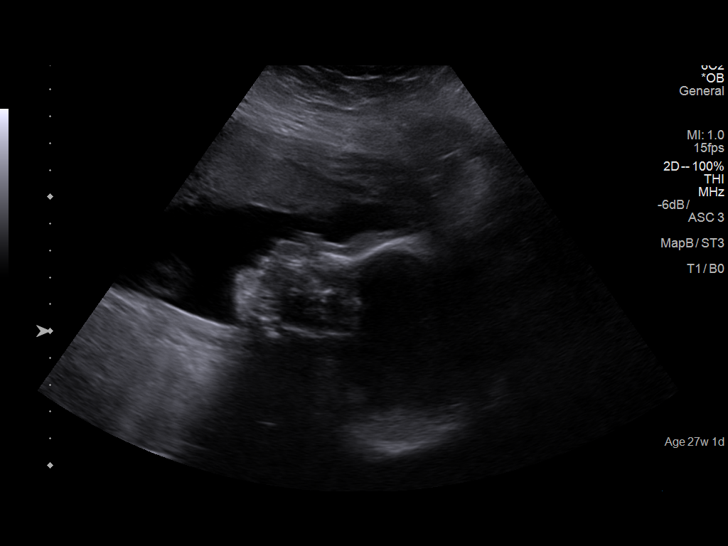
[im 38/61]
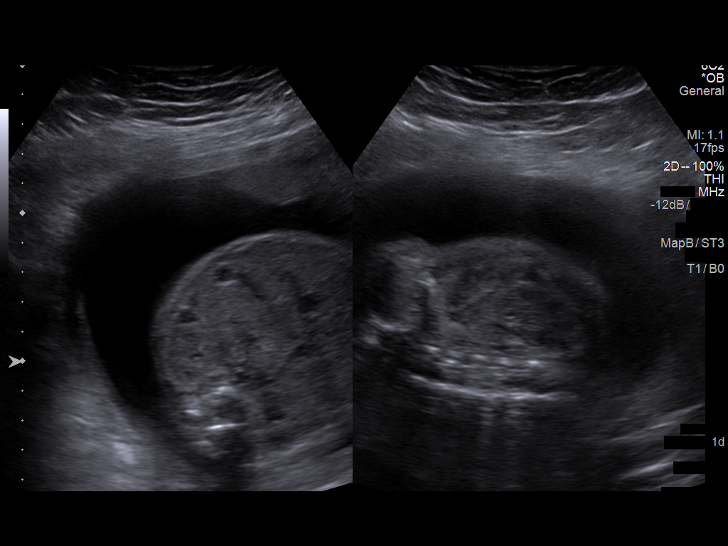
[im 43/61]
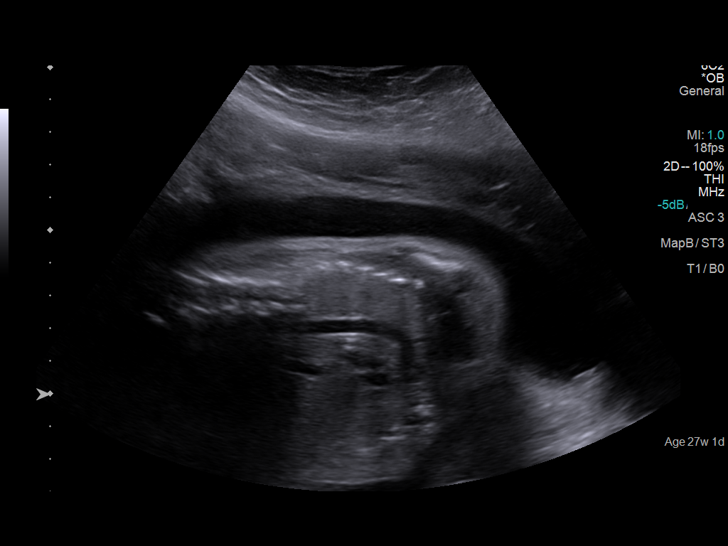
[im 49/61]
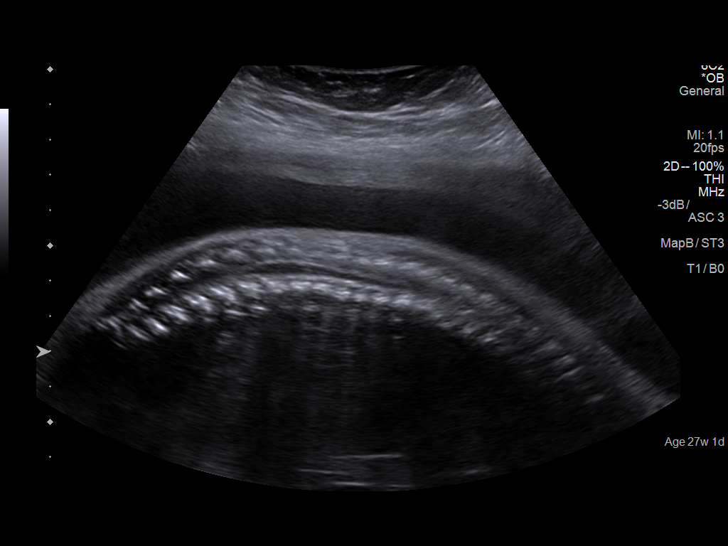
[im 54/61]
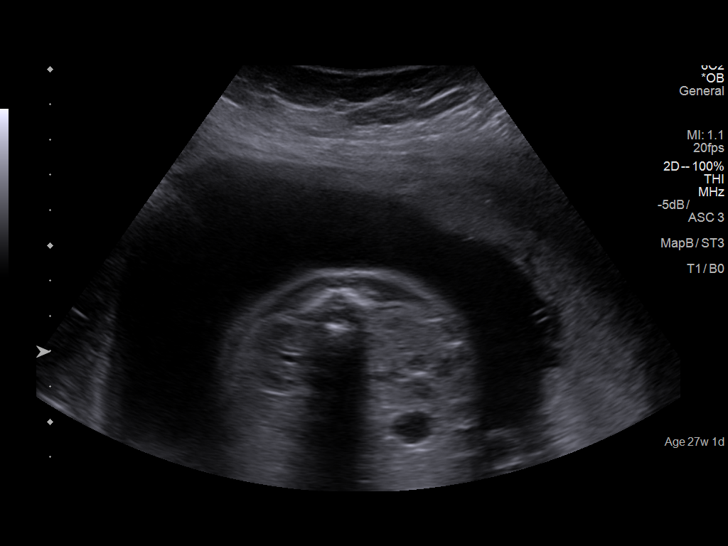
[im 58/61]
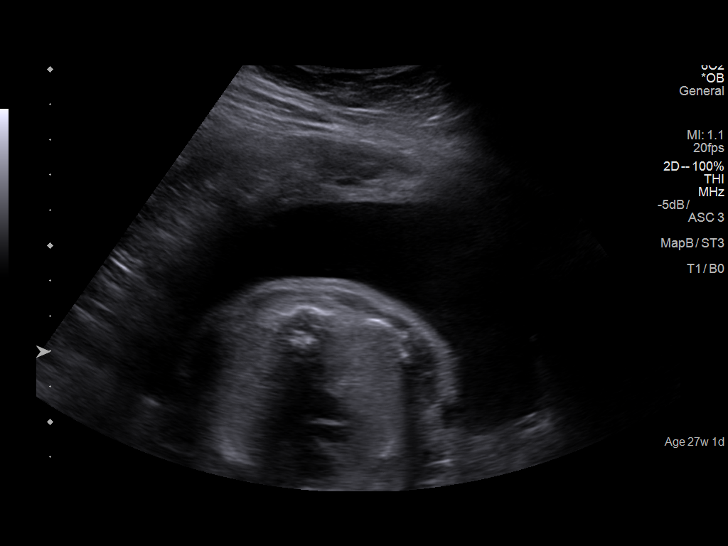

[12 of 28 positions shown; findings below may reference images not displayed]

IMPRESSION: Single intrauterine pregnancy with a best estimated
gestational age of 27 weeks 1 day.  Dating is based on LMP
consistent with earliest available ultrasound performed at
[HOSPITAL] on 01/13/2017; measurements reported as 8 weeks 2
days.

Follow up ultrasound performed to assess growth.  First
trimester complicated by thick nuchal translucency (with
subsequent normal cell free fetal DNA screen results and
normal fetal echocardiogram on 04/16/2017) and second
trimester complicated by presence of choroid plexus cysts.
Pregnancy also complicated by morbid obesity (BMI 42) and
history of macrosomia.

Fetal anatomy visualized appears normal or has been
previously documented.

EFW is >97th percentile with concordant head and abdomen
measurements.

Recommend follow up ultrasound to assess growth given
morbid obesity in 4 weeks.

## 2018-05-20 ENCOUNTER — Emergency Department: Payer: Self-pay

## 2018-05-20 ENCOUNTER — Other Ambulatory Visit: Payer: Self-pay

## 2018-05-20 ENCOUNTER — Encounter: Payer: Self-pay | Admitting: Emergency Medicine

## 2018-05-20 ENCOUNTER — Emergency Department
Admission: EM | Admit: 2018-05-20 | Discharge: 2018-05-20 | Disposition: A | Discharge status: Home / Self Care | Payer: Self-pay | Attending: Emergency Medicine | Admitting: Emergency Medicine

## 2018-05-20 DIAGNOSIS — S40012A Contusion of left shoulder, initial encounter: Secondary | ICD-10-CM | POA: Insufficient documentation

## 2018-05-20 DIAGNOSIS — F17210 Nicotine dependence, cigarettes, uncomplicated: Secondary | ICD-10-CM | POA: Insufficient documentation

## 2018-05-20 DIAGNOSIS — Y999 Unspecified external cause status: Secondary | ICD-10-CM | POA: Insufficient documentation

## 2018-05-20 DIAGNOSIS — W010XXA Fall on same level from slipping, tripping and stumbling without subsequent striking against object, initial encounter: Secondary | ICD-10-CM | POA: Insufficient documentation

## 2018-05-20 DIAGNOSIS — M75102 Unspecified rotator cuff tear or rupture of left shoulder, not specified as traumatic: Secondary | ICD-10-CM | POA: Insufficient documentation

## 2018-05-20 DIAGNOSIS — J45909 Unspecified asthma, uncomplicated: Secondary | ICD-10-CM | POA: Insufficient documentation

## 2018-05-20 DIAGNOSIS — Y939 Activity, unspecified: Secondary | ICD-10-CM | POA: Insufficient documentation

## 2018-05-20 DIAGNOSIS — Y929 Unspecified place or not applicable: Secondary | ICD-10-CM | POA: Insufficient documentation

## 2018-05-20 DIAGNOSIS — M7582 Other shoulder lesions, left shoulder: Secondary | ICD-10-CM

## 2018-05-20 DIAGNOSIS — Z79899 Other long term (current) drug therapy: Secondary | ICD-10-CM | POA: Insufficient documentation

## 2018-05-20 MED ORDER — CYCLOBENZAPRINE HCL 5 MG PO TABS: 5.0000 mg | ORAL_TABLET | Freq: Three times a day (TID) | ORAL | 0 refills | Status: DC | PRN

## 2018-05-20 MED ORDER — ACETAMINOPHEN 500 MG PO TABS
1000.0000 mg | ORAL_TABLET | Freq: Once | ORAL | Status: AC
  Administered 2018-05-20: 1000 mg via ORAL
  Filled 2018-05-20: qty 2

## 2018-05-20 NOTE — ED Notes (Signed)
Pt ambulatory to POV without difficulty. VSS. NAD. Discharge instructions, RX and follow up reviewed. All questions and concerns addressed.  

## 2018-05-20 NOTE — ED Provider Notes (Signed)
[facility] EMERGENCY DEPARTMENT Provider Note   CSN: [id number] Arrival date & time: 05/20/18  2001     History   Chief Complaint Chief Complaint  Patient presents with  . Arm Injury    HPI Samantha Mendez is a 27 y.o. female presents to the emergency department for evaluation of left shoulder pain, forearm pain.  Patient states she slipped and fell earlier today and hit her left shoulder and forearm against an unknown object, likely the floor.  She denies hitting her head or losing consciousness.  No other pain throughout her body except for the left shoulder midforearm.  No numbness tingling radicular symptoms.  No neck pain.  She took ibuprofen earlier today right after the injury with mild relief.  HPI  Past Medical History:  Diagnosis Date  . Asthma   . Back pain   . Migraine   . Sciatic leg pain     Patient Active Problem List   Diagnosis Date Noted  . Supervision of high risk pregnancy, antepartum, third trimester 08/09/2017  . Substance abuse affecting pregnancy in third trimester, antepartum 08/09/2017  . 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 Maternal obesity, antepartum 05/25/2017  . thick NT on first tri screen   . Mild intermittent asthma without complication 12/06/2014  . 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 ADENOIDECTOMY    . ADENOIDECTOMY    . MYRINGOTOMY WITH TUBE PLACEMENT    . TONSILLECTOMY       OB History    Gravida  4   Para  3   Term  3   Preterm  0   AB  1   Living  3     SAB  1   TAB  0   Ectopic  0   Multiple  0   Live Births  3            Home Medications    Prior to Admission medications   Medication Sig Start Date End Date Taking? Authorizing Provider  albuterol (PROVENTIL HFA;VENTOLIN HFA) 108 (90 Base) MCG/ACT inhaler Inhale 2 puffs into the lungs every 6 (six) hours as needed for wheezing.      [provider]  cyclobenzaprine (FLEXERIL) 5 MG tablet Take 1-2 tablets (5-10 mg total) by mouth 3 (three) times daily as needed for muscle spasms. 05/20/18   Evon Slack, PA-C  Dextromethorphan-Guaifenesin (MUCINEX DM) 30-600 MG TB12 Take 1 tablet by mouth 2 (two) times daily as needed. 08/19/17   Farrel Conners, CNM  ibuprofen (ADVIL,MOTRIN) 600 MG tablet Take 1 tablet (600 mg total) by mouth every 6 (six) hours as needed for mild pain, moderate pain or cramping. 08/19/17   Farrel Conners, CNM  Prenatal Vit-Fe Fumarate-FA (PRENATAL MULTIVITAMIN) TABS tablet Take 1 tablet by mouth daily at 12 noon.    [provider]    Family History No family history on file.  Social History Social History   Tobacco Use  . Smoking status: Current Every Day Smoker    Packs/day: 0.50    Types: Cigarettes  . Smokeless tobacco: Never Used  Substance Use Topics  . Alcohol use: Yes    Comment: twice weekly/ last 5/18  . Drug use: No    Comment: last use July/18     Allergies   Ceclor [cefaclor]; Keflex [cephalexin]; Cephalosporins; Latex; Omnicef [cefdinir]; and Sulfa antibiotics   Review of Systems Review  of Systems  Constitutional: Negative for activity change.  Eyes: Negative for pain and visual disturbance.  Respiratory: Negative for shortness of breath.   Cardiovascular: Negative for chest pain and leg swelling.  Gastrointestinal: Negative for abdominal pain.  Genitourinary: Negative for flank pain and pelvic pain.  Musculoskeletal: Positive for arthralgias and myalgias. Negative for gait problem, joint swelling, neck pain and neck stiffness.  Skin: Negative for wound.  Neurological: Negative for dizziness, syncope, weakness, light-headedness, numbness and headaches.  Psychiatric/Behavioral: Negative for confusion and decreased concentration.     Physical Exam Updated Vital Signs BP 116/79 (BP Location: Right Arm)   Pulse 70   Temp 98.5 F (36.9 C)  (Oral)   Resp 20   Ht 5\' 5"  (1.651 m)   Wt 106.6 kg   LMP 05/07/2018 (Exact Date)   SpO2 100%   BMI 39.11 kg/m   Physical Exam  Constitutional: She is oriented to person, place, and time. She appears well-developed and well-nourished.  HENT:  Head: Normocephalic and atraumatic.  Eyes: Conjunctivae are normal.  Neck: Normal range of motion.  Cardiovascular: Normal rate.  Pulmonary/Chest: Effort normal. No respiratory distress.  Musculoskeletal:  Left shoulder with tenderness palpation along the clavicle, proximal humerus.  There is no bruising or swelling noted.  She is able to abduct and flex the arm to 100 degrees with discomfort.  Slightly positive Hawkins and impingement signs.  She has no tenting of the skin or AC joint separation noted.  Patient has full range of motion of the elbow wrist and digits.  Minimally tender along the mid forearm with no swelling or ecchymosis noted.  She is neurovascular intact in left upper extremity.  Neurological: She is alert and oriented to person, place, and time.  Skin: Skin is warm. No rash noted.  Psychiatric: She has a normal mood and affect. Her behavior is normal. Thought content normal.     ED Treatments / Results  Labs (all labs ordered are listed, but only abnormal results are displayed) Labs Reviewed - No data to display  EKG None  Radiology Dg Clavicle Left  Result Date: 05/20/2018 CLINICAL DATA:  Initial evaluation for acute pain status post fall. EXAM: LEFT CLAVICLE - 2+ VIEWS COMPARISON:  None. FINDINGS: No acute fracture dislocation. Acromioclavicular and sternoclavicular joints are approximated. No focal osseous lesions. Visible soft tissues within normal limits. IMPRESSION: Negative. Electronically Signed   By: Rise Mu M.D.   On: 05/20/2018 23:42   Dg Forearm Left  Result Date: 05/20/2018 CLINICAL DATA:  Patient slipped and fell on wet floor. Left forearm pain. EXAM: LEFT FOREARM - 2 VIEW COMPARISON:  None.  FINDINGS: Mild soft tissue swelling over the dorsum of the forearm. No fracture or malalignment. No joint effusion. IMPRESSION: Mild soft tissue swelling about the dorsum of the mid forearm. No acute osseous abnormality. Electronically Signed   By: Tollie Eth M.D.   On: 05/20/2018 20:55   Dg Humerus Left  Result Date: 05/20/2018 CLINICAL DATA:  Pain from the left shoulder to the left wrist after slipping and falling on a wet floor today. EXAM: LEFT HUMERUS - 2+ VIEW COMPARISON:  None. FINDINGS: No fracture.  No bone lesion. Shoulder and wrist joints are normally spaced and aligned. Soft tissues are unremarkable. IMPRESSION: Negative. Electronically Signed   By: Amie Portland M.D.   On: 05/20/2018 20:54    Procedures Procedures (including critical care time)  Medications Ordered in ED Medications  acetaminophen (TYLENOL) tablet 1,000 mg (1,000 mg Oral  Given 05/20/18 2344)     Initial Impression / Assessment and Plan / ED Course  I have reviewed the triage vital signs and the nursing notes.  Pertinent labs & imaging results that were available during my care of the patient were reviewed by me and considered in my medical decision making (see chart for details).     27 year old female with left shoulder and forearm pain after fall.  X-rays show no evidence of acute bony abnormality throughout the clavicle, humerus or forearm.  Patient is placed into a sling for comfort for 3 to 4 days.  She will alternate Tylenol and ibuprofen is given Flexeril for more moderate discomfort as needed.  She will follow-up with primary care provider or orthopedics if no improvement in 3 to 4 days.  Final Clinical Impressions(s) / ED Diagnoses   Final diagnoses:  Rotator cuff tendinitis, left  Contusion of left shoulder, initial encounter    ED Discharge Orders         Ordered    cyclobenzaprine (FLEXERIL) 5 MG tablet  3 times daily PRN     05/20/18 2330           Evon Slack, PA-C 05/20/18  2358    Don Perking, [place], MD 05/25/18 [phone]

## 2018-05-20 NOTE — Discharge Instructions (Signed)
Please apply ice to left shoulder 20 minutes every hour.  He may alternate Tylenol and ibuprofen as needed for pain.  Use Flexeril as needed for more moderate pain.  Use sling over the next 3 to 4 days for comfort, you may come out as needed.  If no improvement in shoulder pain and range of motion in 3 to 4 days follow-up with orthopedics.

## 2018-05-20 NOTE — ED Notes (Signed)
Pt to the er for pain to the left arm medial side from the bicep to the mid forearm and in the wrist. Pt states she slipped and fell backwards and struck her left arm on an unknown source.

## 2018-05-20 NOTE — ED Triage Notes (Signed)
Patient to ER for pain from left shoulder down to left wrist after slipping and falling on wet floor. Patient states she has been able to lift left arm, just hurts.

## 2018-08-23 ENCOUNTER — Emergency Department
Admission: EM | Admit: 2018-08-23 | Discharge: 2018-08-23 | Disposition: A | Discharge status: Home / Self Care | Payer: No Typology Code available for payment source | Attending: Emergency Medicine | Admitting: Emergency Medicine

## 2018-08-23 ENCOUNTER — Encounter: Payer: Self-pay | Admitting: Emergency Medicine

## 2018-08-23 ENCOUNTER — Emergency Department: Payer: No Typology Code available for payment source

## 2018-08-23 DIAGNOSIS — Z91040 Latex allergy status: Secondary | ICD-10-CM | POA: Diagnosis not present

## 2018-08-23 DIAGNOSIS — Z79899 Other long term (current) drug therapy: Secondary | ICD-10-CM | POA: Diagnosis not present

## 2018-08-23 DIAGNOSIS — M25551 Pain in right hip: Secondary | ICD-10-CM | POA: Diagnosis not present

## 2018-08-23 DIAGNOSIS — M25561 Pain in right knee: Secondary | ICD-10-CM | POA: Diagnosis not present

## 2018-08-23 DIAGNOSIS — Y92410 Unspecified street and highway as the place of occurrence of the external cause: Secondary | ICD-10-CM | POA: Insufficient documentation

## 2018-08-23 DIAGNOSIS — J45909 Unspecified asthma, uncomplicated: Secondary | ICD-10-CM | POA: Diagnosis not present

## 2018-08-23 DIAGNOSIS — F17210 Nicotine dependence, cigarettes, uncomplicated: Secondary | ICD-10-CM | POA: Diagnosis not present

## 2018-08-23 MED ORDER — NAPROXEN 500 MG PO TABS: 500.0000 mg | ORAL_TABLET | Freq: Two times a day (BID) | ORAL | 0 refills | Status: AC

## 2018-08-23 MED ORDER — METHOCARBAMOL 750 MG PO TABS: 750.0000 mg | ORAL_TABLET | Freq: Four times a day (QID) | ORAL | 0 refills | Status: DC | PRN

## 2018-08-23 MED ORDER — NAPROXEN 500 MG PO TABS
500.0000 mg | ORAL_TABLET | Freq: Once | ORAL | Status: AC
  Administered 2018-08-23: 500 mg via ORAL
  Filled 2018-08-23: qty 1

## 2018-08-23 NOTE — Discharge Instructions (Signed)
Please follow-up with your primary care provider for symptoms that are not improving over than the week.  Return to the emergency department for symptoms of change or worsen if you are unable to schedule an appointment.
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
## 2018-08-23 NOTE — ED Notes (Signed)
Pt states that she is not pregnant and will sign a waiver
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
## 2018-10-19 ENCOUNTER — Emergency Department
Admission: EM | Admit: 2018-10-19 | Discharge: 2018-10-19 | Disposition: A | Discharge status: Home / Self Care | Payer: Self-pay | Attending: Emergency Medicine | Admitting: Emergency Medicine

## 2018-10-19 ENCOUNTER — Emergency Department: Payer: Self-pay

## 2018-10-19 ENCOUNTER — Other Ambulatory Visit: Payer: Self-pay

## 2018-10-19 ENCOUNTER — Encounter: Payer: Self-pay | Admitting: Emergency Medicine

## 2018-10-19 DIAGNOSIS — Z79899 Other long term (current) drug therapy: Secondary | ICD-10-CM | POA: Insufficient documentation

## 2018-10-19 DIAGNOSIS — J45909 Unspecified asthma, uncomplicated: Secondary | ICD-10-CM | POA: Insufficient documentation

## 2018-10-19 DIAGNOSIS — J40 Bronchitis, not specified as acute or chronic: Secondary | ICD-10-CM | POA: Insufficient documentation

## 2018-10-19 LAB — CBC WITH DIFFERENTIAL/PLATELET
ABS IMMATURE GRANULOCYTES: 0.02 10*3/uL (ref 0.00–0.07)
BASOS ABS: 0 10*3/uL (ref 0.0–0.1)
Basophils Relative: 1 %
EOS ABS: 0.2 10*3/uL (ref 0.0–0.5)
Eosinophils Relative: 2 %
HEMATOCRIT: 39.5 % (ref 36.0–46.0)
HEMOGLOBIN: 13.3 g/dL (ref 12.0–15.0)
IMMATURE GRANULOCYTES: 0 %
LYMPHS ABS: 2.1 10*3/uL (ref 0.7–4.0)
LYMPHS PCT: 25 %
MCH: 32 pg (ref 26.0–34.0)
MCHC: 33.7 g/dL (ref 30.0–36.0)
MCV: 95.2 fL (ref 80.0–100.0)
Monocytes Absolute: 0.7 10*3/uL (ref 0.1–1.0)
Monocytes Relative: 8 %
NEUTROS PCT: 64 %
NRBC: 0 % (ref 0.0–0.2)
Neutro Abs: 5.3 10*3/uL (ref 1.7–7.7)
Platelets: 182 10*3/uL (ref 150–400)
RBC: 4.15 MIL/uL (ref 3.87–5.11)
RDW: 13.8 % (ref 11.5–15.5)
WBC: 8.3 10*3/uL (ref 4.0–10.5)

## 2018-10-19 LAB — BASIC METABOLIC PANEL
ANION GAP: 7 (ref 5–15)
BUN: 15 mg/dL (ref 6–20)
CALCIUM: 8.5 mg/dL — AB (ref 8.9–10.3)
CHLORIDE: 107 mmol/L (ref 98–111)
CO2: 24 mmol/L (ref 22–32)
Creatinine, Ser: 0.64 mg/dL (ref 0.44–1.00)
GFR calc non Af Amer: >60 mL/min (ref >60)
GLUCOSE: 107 mg/dL — AB (ref 70–99)
POTASSIUM: 4 mmol/L (ref 3.5–5.1)
Sodium: 138 mmol/L (ref 135–145)

## 2018-10-19 LAB — POCT PREGNANCY, URINE: PREG TEST UR: NEGATIVE

## 2018-10-19 LAB — TROPONIN I: Troponin I: <0.03 ng/mL (ref <0.03)

## 2018-10-19 LAB — FIBRIN DERIVATIVES D-DIMER (ARMC ONLY): FIBRIN DERIVATIVES D-DIMER (ARMC): 247.16 ng{FEU}/mL (ref 0.00–499.00)

## 2018-10-19 MED ORDER — IPRATROPIUM-ALBUTEROL 0.5-2.5 (3) MG/3ML IN SOLN
3.0000 mL | Freq: Once | RESPIRATORY_TRACT | Status: AC
  Administered 2018-10-19: 3 mL via RESPIRATORY_TRACT
  Filled 2018-10-19: qty 3

## 2018-10-19 MED ORDER — MAGNESIUM SULFATE 2 GM/50ML IV SOLN
2.0000 g | Freq: Once | INTRAVENOUS | Status: AC
  Administered 2018-10-19: 2 g via INTRAVENOUS
  Filled 2018-10-19: qty 50

## 2018-10-19 MED ORDER — PREDNISONE 20 MG PO TABS: 60.0000 mg | ORAL_TABLET | Freq: Every day | ORAL | 0 refills | Status: AC

## 2018-10-19 MED ORDER — SODIUM CHLORIDE 0.9 % IV BOLUS
1000.0000 mL | Freq: Once | INTRAVENOUS | Status: AC
  Administered 2018-10-19: 1000 mL via INTRAVENOUS

## 2018-10-19 MED ORDER — HYDROCOD POLST-CPM POLST ER 10-8 MG/5ML PO SUER: 5.0000 mL | Freq: Every evening | ORAL | 0 refills | Status: DC | PRN

## 2018-10-19 MED ORDER — METHYLPREDNISOLONE SODIUM SUCC 125 MG IJ SOLR
125.0000 mg | Freq: Once | INTRAMUSCULAR | Status: AC
  Administered 2018-10-19: 125 mg via INTRAVENOUS
  Filled 2018-10-19: qty 2

## 2018-10-19 NOTE — ED Notes (Signed)
Patient transported to x-ray. ?

## 2018-10-19 NOTE — ED Provider Notes (Signed)
[facility] Emergency Department Provider Note  ____________________________________________  Time seen: Approximately 8:14 AM  I have reviewed the triage vital signs and the nursing notes.   HISTORY  Chief Complaint Shortness of Breath   HPI Samantha Mendez is a 28 y.o. female with a history of asthma who presents for evaluation of shortness of breath.  Patient reports a dry cough for a few days with nasal congestion.  Started having shortness of breath this morning.  Use her inhaler 6 times with no significant relief.  Also complaining of diffuse chest tightness that has been constant since this morning.  No fever or chills, no nausea or vomiting, no diarrhea.  Patient denies any personal or family history of blood clots, recent travel immobilization, leg pain or swelling, hemoptysis.  She does have an IUD in place.  She works in a daycare.  No exposure to patients with coronavirus.  Past Medical History:  Diagnosis Date  . Asthma   . Back pain   . Migraine   . Sciatic leg pain     Patient Active Problem List   Diagnosis Date Noted  . Supervision of high risk pregnancy, antepartum, third trimester 08/09/2017  . Substance abuse affecting pregnancy in third trimester, antepartum 08/09/2017  . 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 Maternal obesity, antepartum 05/25/2017  . thick NT on first tri screen   . Mild intermittent asthma without complication 12/06/2014  . 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 ADENOIDECTOMY    . ADENOIDECTOMY    . MYRINGOTOMY WITH TUBE PLACEMENT    . TONSILLECTOMY      Prior to Admission medications   Medication Sig Start Date End Date Taking? Authorizing Provider  albuterol (PROVENTIL HFA;VENTOLIN HFA) 108 (90 Base) MCG/ACT inhaler Inhale 2 puffs into the lungs every 6 (six) hours as needed for wheezing.     [provider]  chlorpheniramine-HYDROcodone (TUSSIONEX PENNKINETIC ER) 10-8 MG/5ML SUER Take 5 mLs by mouth at bedtime as needed for cough. 10/19/18   Nita Sickle, MD  Dextromethorphan-Guaifenesin [facility] DM) 30-600 MG TB12 Take 1 tablet by mouth 2 (two) times daily as needed. 08/19/17   Farrel Conners, CNM  methocarbamol (ROBAXIN-750) 750 MG tablet Take 1 tablet (750 mg total) by mouth every 6 (six) hours as needed for muscle spasms. 08/23/18   Triplett, Rulon Eisenmenger B, FNP  naproxen (NAPROSYN) 500 MG tablet Take 1 tablet (500 mg total) by mouth 2 (two) times daily with a meal. 08/23/18   Triplett, Cari B, FNP  predniSONE (DELTASONE) 20 MG tablet Take 3 tablets (60 mg total) by mouth daily for 4 days. 10/19/18 10/23/18  Nita Sickle, MD  Prenatal Vit-Fe Fumarate-FA (PRENATAL MULTIVITAMIN) TABS tablet Take 1 tablet by mouth daily at 12 noon.    [provider]    Allergies Ceclor [cefaclor]; Keflex [cephalexin]; Cephalosporins; Latex; Omnicef [cefdinir]; and Sulfa antibiotics  No family history on file.  Social History Social History   Tobacco Use  . Smoking status: Current Every Day Smoker    Packs/day: 0.50    Types: Cigarettes  . Smokeless tobacco: Never Used  Substance Use Topics  . Alcohol use: Yes    Comment: twice weekly/ last 5/18  . Drug use: No    Comment: last use July/18    Review of Systems  Constitutional: Negative for fever. Eyes: Negative for visual changes. ENT: Negative for sore throat. +  congestion Neck: No neck pain  Cardiovascular: + chest tightness. Negative for chest pain. Respiratory: + shortness of breath, cough Gastrointestinal: Negative for abdominal pain, vomiting or diarrhea. Genitourinary: Negative for dysuria. Musculoskeletal: Negative for back pain. Skin: Negative for rash. Neurological: Negative for headaches, weakness or numbness. Psych: No SI or HI  ____________________________________________   PHYSICAL EXAM:  VITAL SIGNS: ED  Triage Vitals  Enc Vitals Group     BP 10/19/18 0750 116/72     Pulse Rate 10/19/18 0750 78     Resp 10/19/18 0750 (!) 25     Temp 10/19/18 0750 97.7 F (36.5 C)     Temp Source 10/19/18 0750 Oral     SpO2 10/19/18 0750 100 %     Weight 10/19/18 0752 210 lb (95.3 kg)     Height 10/19/18 0752  (1.651 m)     Head Circumference --      Peak Flow --      Pain Score 10/19/18 0751 0     Pain Loc --      Pain Edu? --      Excl. in GC? --     Constitutional: Alert and oriented. Well appearing and in no apparent distress. HEENT:      Head: Normocephalic and atraumatic.         Eyes: Conjunctivae are normal. Sclera is non-icteric.       Mouth/Throat: Mucous membranes are moist.       Neck: Supple with no signs of meningismus. Cardiovascular: Regular rate and rhythm. No murmurs, gallops, or rubs. 2+ symmetrical distal pulses are present in all extremities. No JVD. Respiratory: Slightly increased work of breathing, tachypneic, slightly decreased air movement. No wheezes, crackles, or rhonchi.  Gastrointestinal: Soft, non tender, and non distended with positive bowel sounds. No rebound or guarding. Musculoskeletal: Nontender with normal range of motion in all extremities. No edema, cyanosis, or erythema of extremities. Neurologic: Normal speech and language. Face is symmetric. Moving all extremities. No gross focal neurologic deficits are appreciated. Skin: Skin is warm, dry and intact. No rash noted. Psychiatric: Mood and affect are normal. Speech and behavior are normal.  ____________________________________________   LABS (all labs ordered are listed, but only abnormal results are displayed)  Labs Reviewed  BASIC METABOLIC PANEL - Abnormal; Notable for the following components:      Result Value   Glucose, Bld 107 (*)    Calcium 8.5 (*)    All other components within normal limits  CBC WITH DIFFERENTIAL/PLATELET  TROPONIN I  FIBRIN DERIVATIVES D-DIMER ([facility] ONLY)  POCT  PREGNANCY, URINE   ____________________________________________  EKG  ED ECG REPORT I, Nita Sickle, the attending physician, personally viewed and interpreted this ECG.  Normal sinus rhythm, rate of 72, normal intervals, right axis deviation, no ST elevations or depressions. ____________________________________________  RADIOLOGY  I have personally reviewed the images performed during this visit and I agree with the Radiologist's read.   Interpretation by Radiologist:  Dg Chest 2 View  Result Date: 10/19/2018 CLINICAL DATA:  Shortness of breath EXAM: CHEST - 2 VIEW COMPARISON:  March 07, 2014 FINDINGS: The lungs are clear. The heart size and pulmonary vascularity are normal. No adenopathy. No bone lesions. IMPRESSION: No edema or consolidation. Electronically Signed   By: Bretta Bang III M.D.   On: 10/19/2018 08:42      ____________________________________________   PROCEDURES  Procedure(s) performed: None Procedures Critical Care performed:  None ____________________________________________   INITIAL IMPRESSION / ASSESSMENT AND PLAN /  ED COURSE  28 y.o. female with a history of asthma who presents for evaluation of shortness of breath and chest tightness in the setting of URI symptoms.  No fever, no body aches, no myalgias, no vomiting or diarrhea therefore less likely to be influenza.  She does work in a daycare and gets exposed to several other viruses.  On exam she has slightly increased work of breathing with slightly diminished air movement with no significant wheezing or crackles.  Will give duo nebs, Solu-Medrol, and IV magnesium.  We will do a chest x-ray to rule out pneumonia, pneumothorax, pulmonary edema.  Will do EKG and troponin to rule out pericarditis/myocarditis although low suspicion with no fever. At this point low suspicion for PE.      As part of my medical decision making, I reviewed the following data within the electronic MEDICAL RECORD NUMBER  Nursing notes reviewed and incorporated, Labs reviewed , EKG interpreted , Old chart reviewed, Radiograph reviewed , Notes from prior ED visits and [place] Controlled Substance Database    Pertinent labs & imaging results that were available during my care of the patient were reviewed by me and considered in my medical decision making (see chart for details).    ____________________________________________   FINAL CLINICAL IMPRESSION(S) / ED DIAGNOSES  Final diagnoses:  Bronchitis      NEW MEDICATIONS STARTED DURING THIS VISIT:  ED Discharge Orders         Ordered    predniSONE (DELTASONE) 20 MG tablet  Daily     10/19/18 1209    chlorpheniramine-HYDROcodone (TUSSIONEX PENNKINETIC ER) 10-8 MG/5ML SUER  At bedtime PRN     10/19/18 1209           Note:  This document was prepared using Dragon voice recognition software and may include unintentional dictation errors.    Nita Sickle, MD 10/19/18 [phone]

## 2018-10-19 NOTE — ED Notes (Signed)
Pt c/o persistent cough; denies SOB at this time.

## 2018-10-19 NOTE — ED Triage Notes (Signed)
Patient presents to ED via POV from home due to shortness of breath that started on Friday. Patient reports history of asthma and chronic bronchitis. Patient reports using her inhaler today with little relief. Labored respirations noted.

## 2018-10-19 NOTE — ED Notes (Signed)
Pt states feeling "tightness on my chest when I'm coughing". Pt denies pain at this time. Pt denies SOB.

## 2018-12-27 IMAGING — DX DG ANKLE COMPLETE 3+V*R*
3 series · 3 of 3 positions shown · non-contrast
Comparison: 03/23/2014

CLINICAL DATA: Recent dancing injury with ankle pain, initial
encounter

EXAM:
RIGHT ANKLE - COMPLETE 3+ VIEW

[ankle ap]
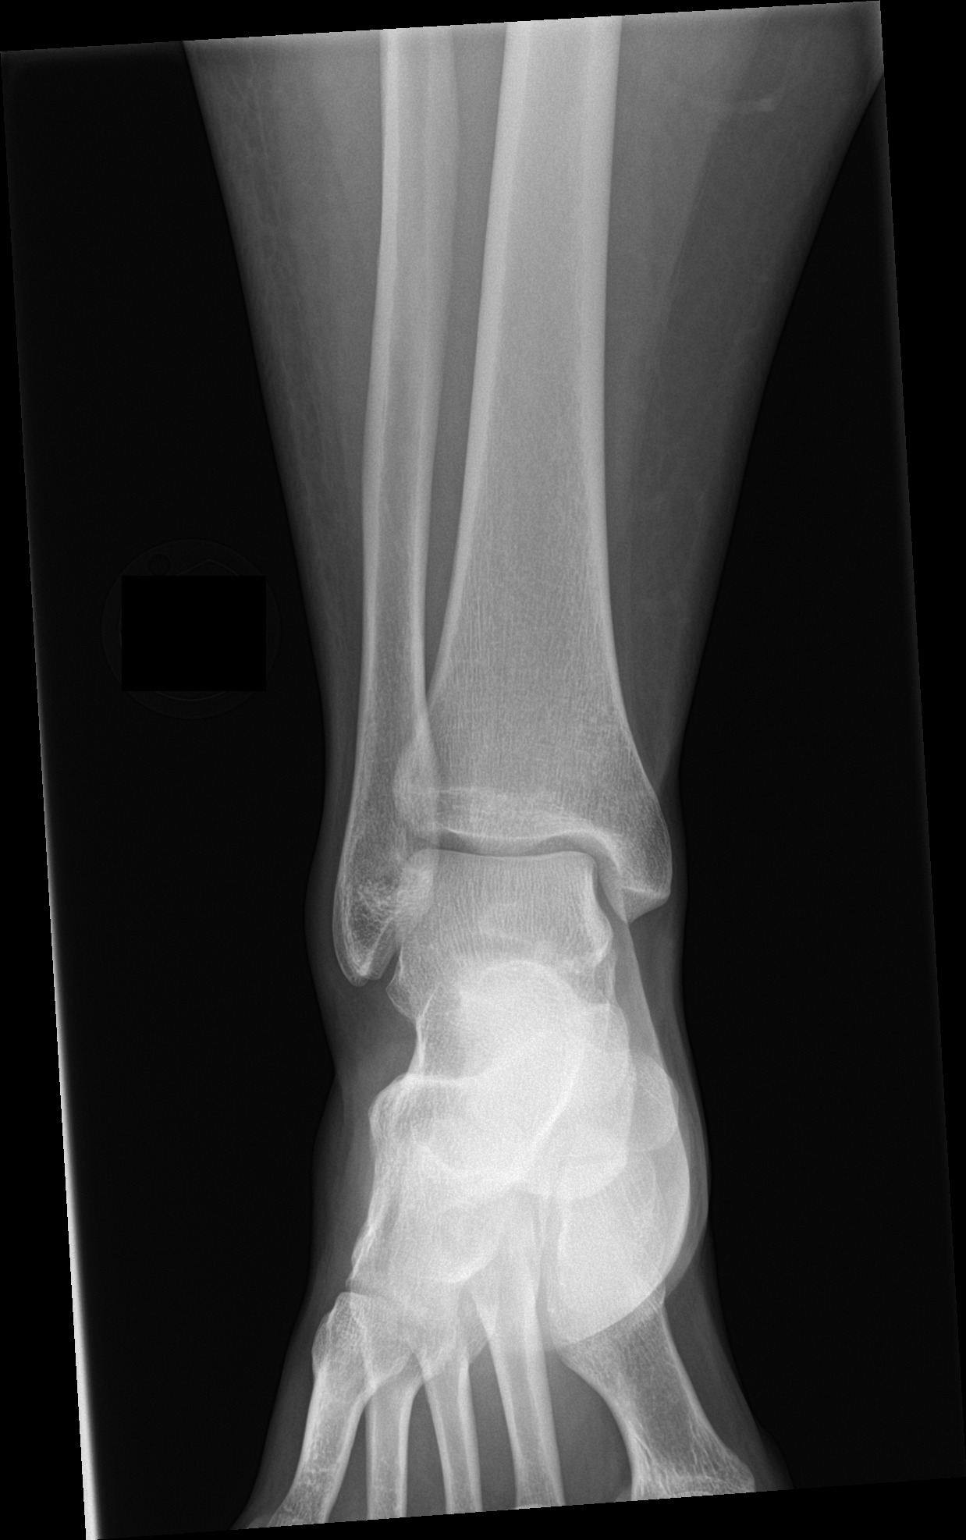

[ankle obl]
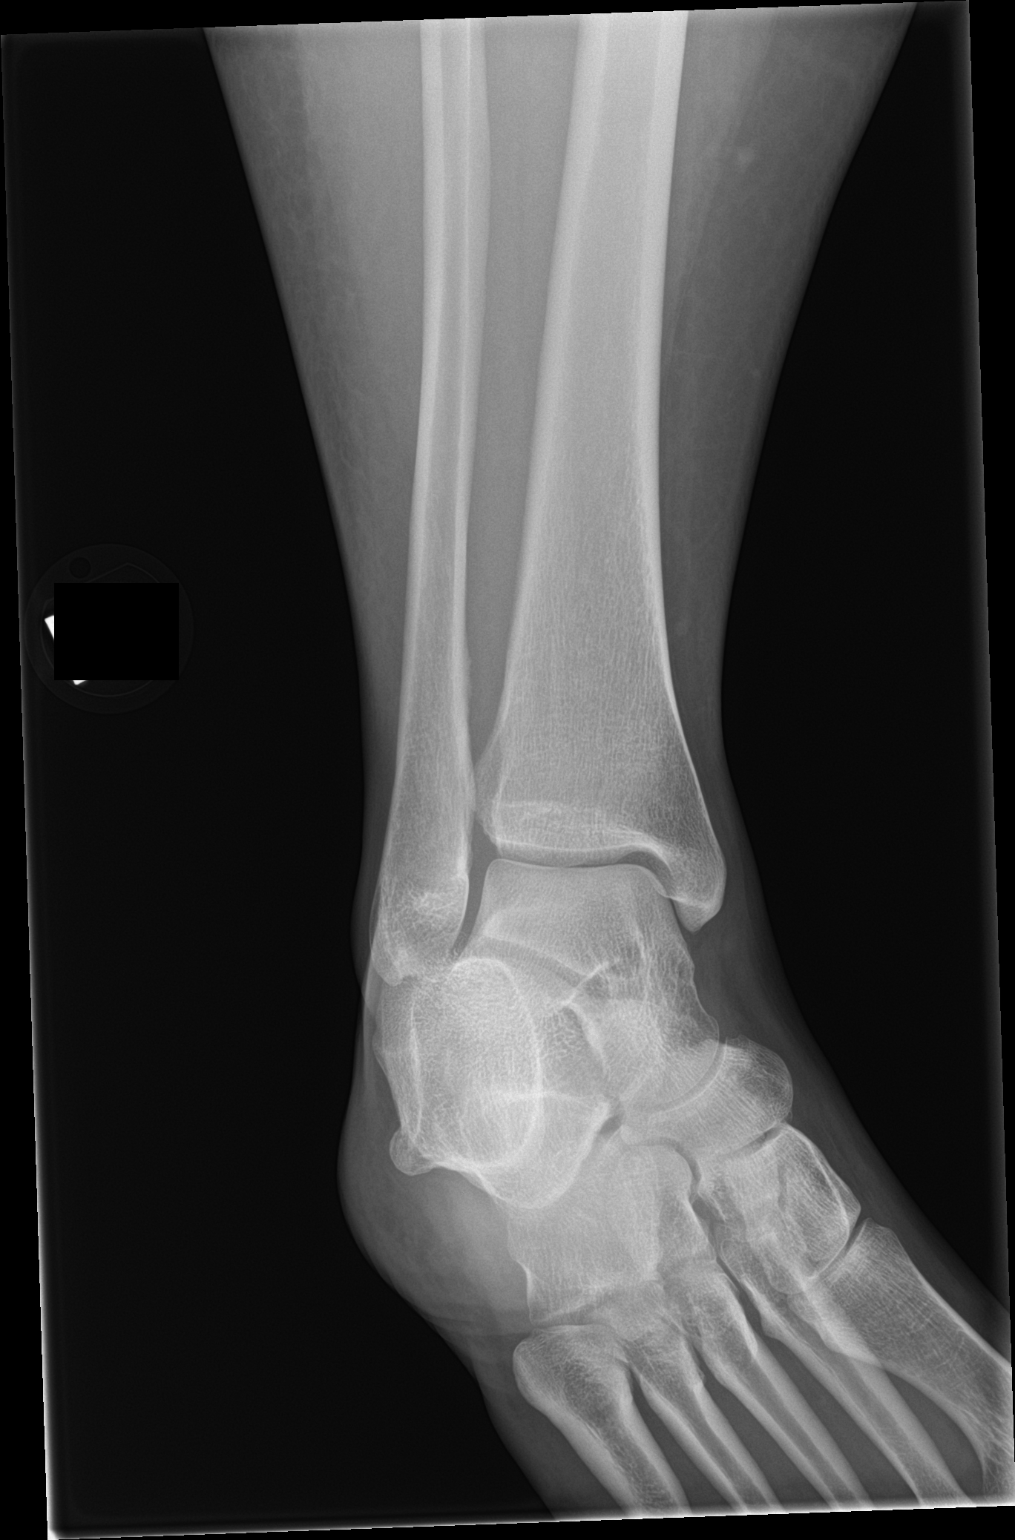

[ankle lat]
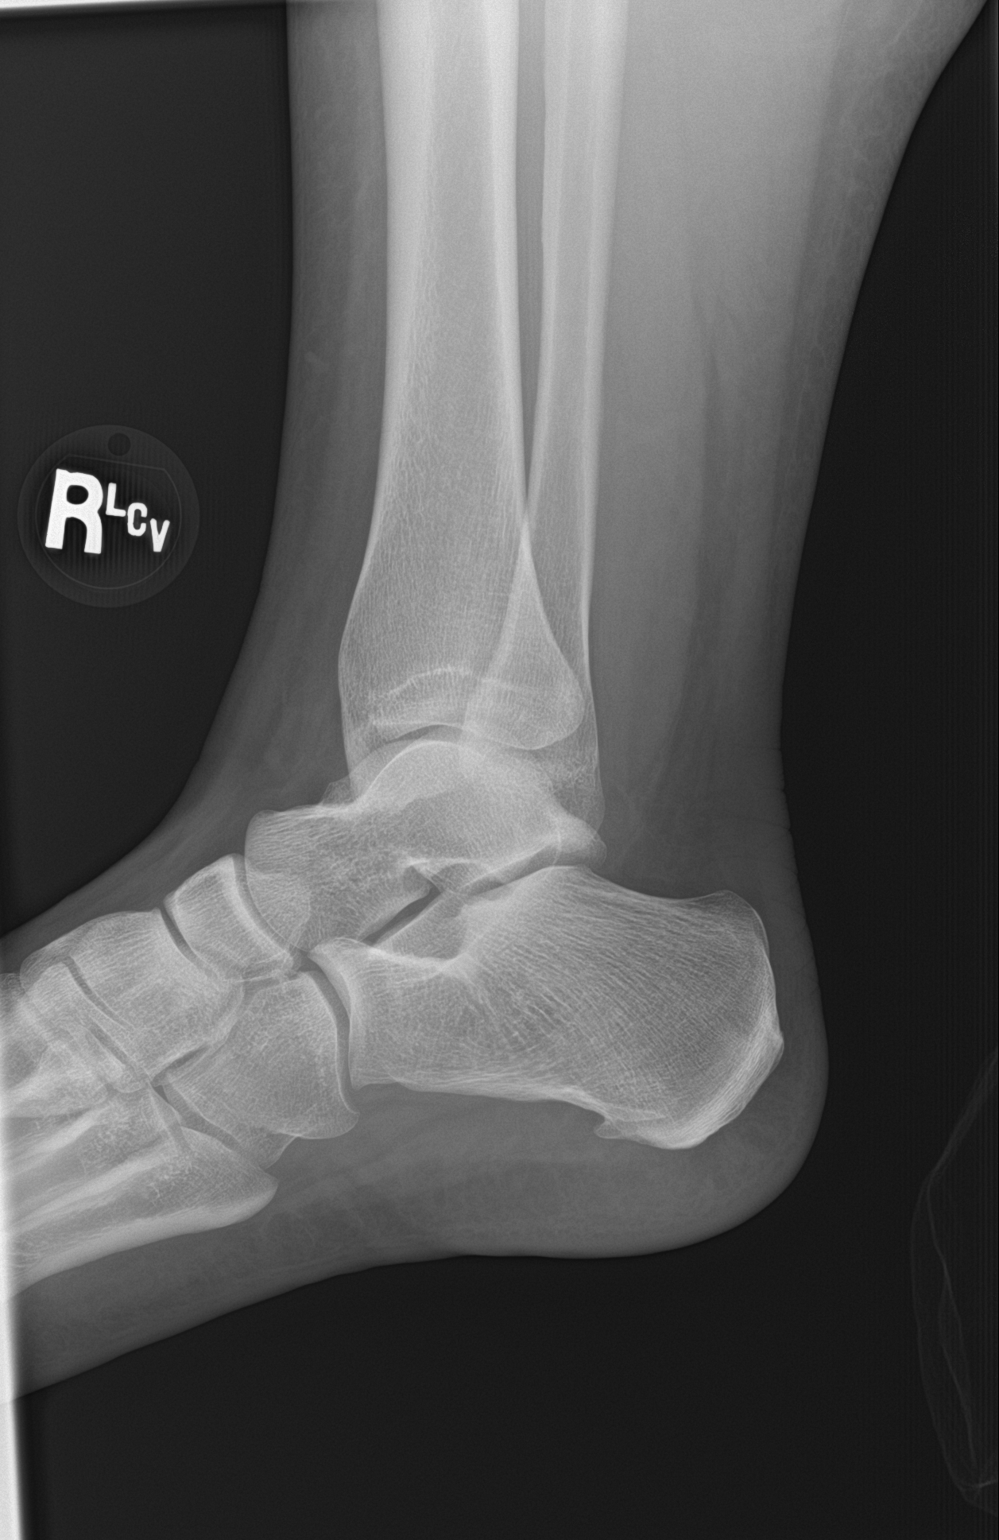

[3 of 3 positions shown; findings below may reference images not displayed]

FINDINGS: Small calcaneal spur is noted. No acute fracture or dislocation is
noted. No soft tissue changes are seen.
IMPRESSION: No acute abnormality noted.

## 2019-01-22 ENCOUNTER — Emergency Department
Admission: EM | Admit: 2019-01-22 | Discharge: 2019-01-22 | Disposition: A | Discharge status: Home / Self Care | Payer: Medicaid Other | Attending: Emergency Medicine | Admitting: Emergency Medicine

## 2019-01-22 ENCOUNTER — Other Ambulatory Visit: Payer: Self-pay

## 2019-01-22 ENCOUNTER — Encounter: Payer: Self-pay | Admitting: Emergency Medicine

## 2019-01-22 ENCOUNTER — Emergency Department: Payer: Medicaid Other

## 2019-01-22 DIAGNOSIS — J45909 Unspecified asthma, uncomplicated: Secondary | ICD-10-CM | POA: Insufficient documentation

## 2019-01-22 DIAGNOSIS — F17210 Nicotine dependence, cigarettes, uncomplicated: Secondary | ICD-10-CM | POA: Diagnosis not present

## 2019-01-22 DIAGNOSIS — Z79899 Other long term (current) drug therapy: Secondary | ICD-10-CM | POA: Insufficient documentation

## 2019-01-22 DIAGNOSIS — W010XXA Fall on same level from slipping, tripping and stumbling without subsequent striking against object, initial encounter: Secondary | ICD-10-CM | POA: Insufficient documentation

## 2019-01-22 DIAGNOSIS — Z91040 Latex allergy status: Secondary | ICD-10-CM | POA: Diagnosis not present

## 2019-01-22 DIAGNOSIS — M79632 Pain in left forearm: Secondary | ICD-10-CM | POA: Insufficient documentation

## 2019-01-22 NOTE — ED Notes (Signed)
No peripheral IV placed this visit.   Discharge instructions reviewed with patient. Questions fielded by this RN. Patient verbalizes understanding of instructions. Patient discharged home in stable condition per Jackie, PA. No acute distress noted at time of discharge.   

## 2019-01-22 NOTE — ED Notes (Addendum)
Pt reports slipping in dog bowl slime/water and catching herself with left arm now with pain to left wrist on flexation, CMS intact, pt able to bend wrist, no obvious swelling noted, hx of cyst to posterior left wrist and appears with cyst att, pt reports for the last 2 years  Multiple scarring to extremities; pt reports hx of "cutting"

## 2019-01-22 NOTE — ED Provider Notes (Signed)
[facility] Emergency Department Provider Note  ____________________________________________  Time seen: Approximately 9:05 PM  I have reviewed the triage vital signs and the nursing notes.   HISTORY  Chief Complaint Fall and Arm Pain    HPI Samantha Mendez is a 28 y.o. female presents to the emergency department with left forearm pain after slipping in some water at her house.  Patient denies pain in the wrist or left shoulder.  No numbness or tingling in the left upper extremity.  No neck pain.  Patient did not hit her head during injury.  Patient states that she sustained a left wrist fracture and was concern for reinjury.  No other alleviating measures have been attempted.        Past Medical History:  Diagnosis Date  . Asthma   . Back pain   . Migraine   . Sciatic leg pain     Patient Active Problem List   Diagnosis Date Noted  . Supervision of high risk pregnancy, antepartum, third trimester 08/09/2017  . Substance abuse affecting pregnancy in third trimester, antepartum 08/09/2017  . 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 Maternal obesity, antepartum 05/25/2017  . thick NT on first tri screen   . Mild intermittent asthma without complication 93/79/0240  . Major depression in partial remission ([place]) 12/16/2012    Past Surgical History:  Procedure Laterality Date  . ADENOIDECTOMY    . ADENOIDECTOMY    . MYRINGOTOMY WITH TUBE PLACEMENT    . TONSILLECTOMY      Prior to Admission medications   Medication Sig Start Date End Date Taking? Authorizing Provider  albuterol (PROVENTIL HFA;VENTOLIN HFA) 108 (90 Base) MCG/ACT inhaler Inhale 2 puffs into the lungs every 6 (six) hours as needed for wheezing.     [provider]  chlorpheniramine-HYDROcodone (TUSSIONEX PENNKINETIC ER) 10-8 MG/5ML SUER Take 5 mLs by mouth at bedtime as needed for cough. 10/19/18   Rudene Re, MD  Dextromethorphan-Guaifenesin [facility] DM)  30-600 MG TB12 Take 1 tablet by mouth 2 (two) times daily as needed. 08/19/17   Dalia Heading, CNM  methocarbamol (ROBAXIN-750) 750 MG tablet Take 1 tablet (750 mg total) by mouth every 6 (six) hours as needed for muscle spasms. 08/23/18   Triplett, Johnette Abraham B, FNP  naproxen (NAPROSYN) 500 MG tablet Take 1 tablet (500 mg total) by mouth 2 (two) times daily with a meal. 08/23/18   Triplett, Cari B, FNP  Prenatal Vit-Fe Fumarate-FA (PRENATAL MULTIVITAMIN) TABS tablet Take 1 tablet by mouth daily at 12 noon.    [provider]    Allergies Ceclor [cefaclor], Keflex [cephalexin], Cephalosporins, Latex, Omnicef [cefdinir], and Sulfa antibiotics  History reviewed. No pertinent family history.  Social History Social History   Tobacco Use  . Smoking status: Current Every Day Smoker    Packs/day: 0.50    Types: Cigarettes  . Smokeless tobacco: Never Used  Substance Use Topics  . Alcohol use: Yes    Comment: twice weekly/ last 5/18  . Drug use: No    Comment: last use July/18     Review of Systems  Constitutional: No fever/chills Eyes: No visual changes. No discharge ENT: No upper respiratory complaints. Cardiovascular: no chest pain. Respiratory: no cough. No SOB. Gastrointestinal: No abdominal pain.  No nausea, no vomiting.  No diarrhea.  No constipation. Musculoskeletal: Patient has left forearm pain.  Skin: Negative for rash, abrasions, lacerations, ecchymosis. Neurological: Negative for headaches, focal weakness or numbness.   ____________________________________________   PHYSICAL  EXAM:  VITAL SIGNS: ED Triage Vitals  Enc Vitals Group     BP 01/22/19 1935 134/63     Pulse Rate 01/22/19 1935 70     Resp 01/22/19 1935 18     Temp 01/22/19 1935 98.1 F (36.7 C)     Temp Source 01/22/19 1935 Oral     SpO2 01/22/19 1935 98 %     Weight 01/22/19 1937 205 lb (93 kg)     Height 01/22/19 1937 5\' 5"  (1.651 m)     Head Circumference --      Peak Flow --      Pain  Score 01/22/19 1937 6     Pain Loc --      Pain Edu? --      Excl. in GC? --      Constitutional: Alert and oriented. Well appearing and in no acute distress. Eyes: Conjunctivae are normal. PERRL. EOMI. Head: Atraumatic. Cardiovascular: Normal rate, regular rhythm. Normal S1 and S2.  Good peripheral circulation. Respiratory: Normal respiratory effort without tachypnea or retractions. Lungs CTAB. Good air entry to the bases with no decreased or absent breath sounds. Musculoskeletal: Patient has 5 out of 5 strength in the upper extremities bilaterally and symmetrically.  Patient can perform full range of motion at the left shoulder, left elbow and left wrist.  No pain to palpation over the left anatomical snuffbox.  Palpable radial pulse bilaterally and symmetrically. Neurologic:  Normal speech and language. No gross focal neurologic deficits are appreciated.  Skin:  Skin is warm, dry and intact. No rash noted. Psychiatric: Mood and affect are normal. Speech and behavior are normal. Patient exhibits appropriate insight and judgement.   ____________________________________________   LABS (all labs ordered are listed, but only abnormal results are displayed)  Labs Reviewed - No data to display ____________________________________________  EKG   ____________________________________________  RADIOLOGY I personally viewed and evaluated these images as part of my medical decision making, as well as reviewing the written report by the radiologist.  Dg Forearm Left  Result Date: 01/22/2019 CLINICAL DATA:  Fall, left forearm pain EXAM: LEFT FOREARM - 2 VIEW COMPARISON:  None. FINDINGS: There is no evidence of fracture or other focal bone lesions. Soft tissues are unremarkable. IMPRESSION: Negative. Electronically Signed   By: Charlett NoseKevin  Dover M.D.   On: 01/22/2019 20:39    ____________________________________________    PROCEDURES  Procedure(s) performed:     Procedures    Medications - No data to display   ____________________________________________   INITIAL IMPRESSION / ASSESSMENT AND PLAN / ED COURSE  Pertinent labs & imaging results that were available during my care of the patient were reviewed by me and considered in my medical decision making (see chart for details).  Review of the [place] CSRS was performed in accordance of the [facility] prior to dispensing any controlled drugs.           Assessment and plan Left forearm pain Patient presents to the emergency department with acute left forearm pain after patient slipped in some water and fell.  On physical exam, patient had full range of motion at the left shoulder, left elbow and left wrist.  She had no pain with palpation of the anatomical snuffbox.  X-ray examination of the left forearm revealed no acute bony abnormality.  Patient was placed in a Velcro wrist splint and advised to take naproxen at home.  She was advised to follow-up with orthopedics.  All patient questions were answered.    ____________________________________________  FINAL CLINICAL IMPRESSION(S) / ED DIAGNOSES  Final diagnoses:  Pain of left forearm      NEW MEDICATIONS STARTED DURING THIS VISIT:  ED Discharge Orders    None          This chart was dictated using voice recognition software/Dragon. Despite best efforts to proofread, errors can occur which can change the meaning. Any change was purely unintentional.    Gasper LloydWoods, Tammye Kahler M, PA-C 01/22/19 2108    Minna AntisPaduchowski, Kevin, MD 01/22/19 2141

## 2019-01-22 NOTE — ED Triage Notes (Signed)
Pt reports she slipped in some water on the floor. Unsure of how she landed but now having pain to her left forearm. CMS intact.

## 2019-01-30 ENCOUNTER — Emergency Department
Admission: EM | Admit: 2019-01-30 | Discharge: 2019-01-30 | Disposition: A | Discharge status: Home / Self Care | Payer: Medicaid Other | Attending: Emergency Medicine | Admitting: Emergency Medicine

## 2019-01-30 ENCOUNTER — Other Ambulatory Visit: Payer: Self-pay

## 2019-01-30 DIAGNOSIS — R109 Unspecified abdominal pain: Secondary | ICD-10-CM | POA: Insufficient documentation

## 2019-01-30 DIAGNOSIS — Z5321 Procedure and treatment not carried out due to patient leaving prior to being seen by health care provider: Secondary | ICD-10-CM | POA: Diagnosis not present

## 2019-01-30 LAB — URINALYSIS, COMPLETE (UACMP) WITH MICROSCOPIC
Bacteria, UA: NONE SEEN
Bilirubin Urine: NEGATIVE
Glucose, UA: NEGATIVE mg/dL
Hgb urine dipstick: NEGATIVE
Ketones, ur: NEGATIVE mg/dL
Nitrite: NEGATIVE
Protein, ur: NEGATIVE mg/dL
Specific Gravity, Urine: 1.027 (ref 1.005–1.030)
pH: 7 (ref 5.0–8.0)

## 2019-01-30 LAB — COMPREHENSIVE METABOLIC PANEL
ALT: 15 U/L (ref 0–44)
AST: 19 U/L (ref 15–41)
Albumin: 4.2 g/dL (ref 3.5–5.0)
Alkaline Phosphatase: 49 U/L (ref 38–126)
Anion gap: 7 (ref 5–15)
BUN: 17 mg/dL (ref 6–20)
CO2: 26 mmol/L (ref 22–32)
Calcium: 8.6 mg/dL — ABNORMAL LOW (ref 8.9–10.3)
Chloride: 107 mmol/L (ref 98–111)
Creatinine, Ser: 0.85 mg/dL (ref 0.44–1.00)
GFR calc Af Amer: >60 mL/min (ref >60)
GFR calc non Af Amer: >60 mL/min (ref >60)
Glucose, Bld: 97 mg/dL (ref 70–99)
Potassium: 3.8 mmol/L (ref 3.5–5.1)
Sodium: 140 mmol/L (ref 135–145)
Total Bilirubin: 0.5 mg/dL (ref 0.3–1.2)
Total Protein: 6.8 g/dL (ref 6.5–8.1)

## 2019-01-30 LAB — CBC
HCT: 40.6 % (ref 36.0–46.0)
Hemoglobin: 13.8 g/dL (ref 12.0–15.0)
MCH: 31.7 pg (ref 26.0–34.0)
MCHC: 34 g/dL (ref 30.0–36.0)
MCV: 93.3 fL (ref 80.0–100.0)
Platelets: 174 10*3/uL (ref 150–400)
RBC: 4.35 MIL/uL (ref 3.87–5.11)
RDW: 13.1 % (ref 11.5–15.5)
WBC: 9.2 10*3/uL (ref 4.0–10.5)
nRBC: 0 % (ref 0.0–0.2)

## 2019-01-30 LAB — POCT PREGNANCY, URINE: Preg Test, Ur: NEGATIVE

## 2019-01-30 NOTE — ED Triage Notes (Signed)
Pt states right lower and mid lower abd "cramping" that began this am. Pt denies fever, vomiting, diarrhea. Pt states she has had nausea. Pt appears in no acute distress. Pt denies dysuria.

## 2019-01-31 ENCOUNTER — Telehealth: Payer: Self-pay | Admitting: Emergency Medicine

## 2019-01-31 NOTE — Telephone Encounter (Signed)
Called patient due to lwot to inquire about condition and follow up plans.  She says she is still having some pain.   She does not have pcp.  I told her that she can always return here and that muc could see her, but that it is important that she have abdominal exam.

## 2019-03-31 ENCOUNTER — Other Ambulatory Visit: Payer: Self-pay

## 2019-03-31 ENCOUNTER — Emergency Department
Admission: EM | Admit: 2019-03-31 | Discharge: 2019-04-01 | Disposition: A | Discharge status: Other Acute Inpatient Hospital | Payer: Medicaid Other | Attending: Emergency Medicine | Admitting: Emergency Medicine

## 2019-03-31 ENCOUNTER — Encounter: Payer: Self-pay | Admitting: *Deleted

## 2019-03-31 DIAGNOSIS — Y929 Unspecified place or not applicable: Secondary | ICD-10-CM | POA: Diagnosis not present

## 2019-03-31 DIAGNOSIS — F332 Major depressive disorder, recurrent severe without psychotic features: Secondary | ICD-10-CM

## 2019-03-31 DIAGNOSIS — J45909 Unspecified asthma, uncomplicated: Secondary | ICD-10-CM | POA: Diagnosis not present

## 2019-03-31 DIAGNOSIS — Z20828 Contact with and (suspected) exposure to other viral communicable diseases: Secondary | ICD-10-CM | POA: Diagnosis not present

## 2019-03-31 DIAGNOSIS — S61511A Laceration without foreign body of right wrist, initial encounter: Secondary | ICD-10-CM | POA: Diagnosis not present

## 2019-03-31 DIAGNOSIS — Z91040 Latex allergy status: Secondary | ICD-10-CM | POA: Diagnosis not present

## 2019-03-31 DIAGNOSIS — X789XXA Intentional self-harm by unspecified sharp object, initial encounter: Secondary | ICD-10-CM | POA: Diagnosis not present

## 2019-03-31 DIAGNOSIS — R45851 Suicidal ideations: Secondary | ICD-10-CM | POA: Insufficient documentation

## 2019-03-31 DIAGNOSIS — F17210 Nicotine dependence, cigarettes, uncomplicated: Secondary | ICD-10-CM | POA: Diagnosis not present

## 2019-03-31 DIAGNOSIS — T1491XA Suicide attempt, initial encounter: Secondary | ICD-10-CM

## 2019-03-31 DIAGNOSIS — X781XXA Intentional self-harm by knife, initial encounter: Secondary | ICD-10-CM

## 2019-03-31 DIAGNOSIS — Y999 Unspecified external cause status: Secondary | ICD-10-CM | POA: Diagnosis not present

## 2019-03-31 DIAGNOSIS — Y939 Activity, unspecified: Secondary | ICD-10-CM | POA: Insufficient documentation

## 2019-03-31 LAB — COMPREHENSIVE METABOLIC PANEL
ALT: 12 U/L (ref 0–44)
AST: 16 U/L (ref 15–41)
Albumin: 4.3 g/dL (ref 3.5–5.0)
Alkaline Phosphatase: 46 U/L (ref 38–126)
Anion gap: 6 (ref 5–15)
BUN: 12 mg/dL (ref 6–20)
CO2: 22 mmol/L (ref 22–32)
Calcium: 8.6 mg/dL — ABNORMAL LOW (ref 8.9–10.3)
Chloride: 107 mmol/L (ref 98–111)
Creatinine, Ser: 0.75 mg/dL (ref 0.44–1.00)
GFR calc Af Amer: >60 mL/min (ref >60)
GFR calc non Af Amer: >60 mL/min (ref >60)
Glucose, Bld: 107 mg/dL — ABNORMAL HIGH (ref 70–99)
Potassium: 3.7 mmol/L (ref 3.5–5.1)
Sodium: 135 mmol/L (ref 135–145)
Total Bilirubin: 0.8 mg/dL (ref 0.3–1.2)
Total Protein: 7.2 g/dL (ref 6.5–8.1)

## 2019-03-31 LAB — URINE DRUG SCREEN, QUALITATIVE (ARMC ONLY)
Amphetamines, Ur Screen: NOT DETECTED
Barbiturates, Ur Screen: NOT DETECTED
Benzodiazepine, Ur Scrn: NOT DETECTED
Cannabinoid 50 Ng, Ur ~~LOC~~: POSITIVE — AB
Cocaine Metabolite,Ur ~~LOC~~: NOT DETECTED
MDMA (Ecstasy)Ur Screen: NOT DETECTED
Methadone Scn, Ur: NOT DETECTED
Opiate, Ur Screen: NOT DETECTED
Phencyclidine (PCP) Ur S: NOT DETECTED
Tricyclic, Ur Screen: NOT DETECTED

## 2019-03-31 LAB — CBC
HCT: 43.1 % (ref 36.0–46.0)
Hemoglobin: 14.8 g/dL (ref 12.0–15.0)
MCH: 31.8 pg (ref 26.0–34.0)
MCHC: 34.3 g/dL (ref 30.0–36.0)
MCV: 92.5 fL (ref 80.0–100.0)
Platelets: 187 10*3/uL (ref 150–400)
RBC: 4.66 MIL/uL (ref 3.87–5.11)
RDW: 13.7 % (ref 11.5–15.5)
WBC: 8.2 10*3/uL (ref 4.0–10.5)
nRBC: 0 % (ref 0.0–0.2)

## 2019-03-31 LAB — SALICYLATE LEVEL: Salicylate Lvl: <7 mg/dL (ref 2.8–30.0)

## 2019-03-31 LAB — POCT PREGNANCY, URINE: Preg Test, Ur: NEGATIVE

## 2019-03-31 LAB — ETHANOL: Alcohol, Ethyl (B): <10 mg/dL (ref <10)

## 2019-03-31 LAB — ACETAMINOPHEN LEVEL: Acetaminophen (Tylenol), Serum: <10 ug/mL — ABNORMAL LOW (ref 10–30)

## 2019-03-31 MED ORDER — IBUPROFEN 600 MG PO TABS
600.0000 mg | ORAL_TABLET | Freq: Once | ORAL | Status: AC
  Administered 2019-03-31: 600 mg via ORAL
  Filled 2019-03-31: qty 1

## 2019-03-31 NOTE — ED Notes (Signed)
She has taken a shower   

## 2019-03-31 NOTE — ED Notes (Signed)
Hourly rounding reveals patient in room. No complaints, stable, in no acute distress. Q15 minute rounds and monitoring via Rover and Officer to continue.   

## 2019-03-31 NOTE — ED Provider Notes (Signed)
[facility] Emergency Department Provider Note       Time seen: ----------------------------------------- 12:05 PM on 03/31/2019 -----------------------------------------   I have reviewed the triage vital signs and the nursing notes.  HISTORY   Chief Complaint Behavioral Evaluation    HPI Samantha Mendez is a 28 y.o. female with a history of asthma, migraines, substance abuse, depression who presents to the ED for she cut her right wrist.  Patient reports this was not suicidal action but rather to feel pain.  Patient states she does having a bad day.  She states she is been cutting and performing self-harm since she was a teenager but the past month she has been having SI.  Occasional alcohol abuse.  Past Medical History:  Diagnosis Date  . Asthma   . Back pain   . Migraine   . Sciatic leg pain     Patient Active Problem List   Diagnosis Date Noted  . Supervision of high risk pregnancy, antepartum, third trimester 08/09/2017  . Substance abuse affecting pregnancy in third trimester, antepartum 08/09/2017  . 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 Maternal obesity, antepartum 05/25/2017  . thick NT on first tri screen   . Mild intermittent asthma without complication 66/01/3015  . Major depression in partial remission ([place]) 12/16/2012    Past Surgical History:  Procedure Laterality Date  . ADENOIDECTOMY    . ADENOIDECTOMY    . MYRINGOTOMY WITH TUBE PLACEMENT    . TONSILLECTOMY      Allergies Ceclor [cefaclor], Keflex [cephalexin], Cephalosporins, Latex, Omnicef [cefdinir], and Sulfa antibiotics  Social History Social History   Tobacco Use  . Smoking status: Current Every Day Smoker    Packs/day: 0.50    Types: Cigarettes  . Smokeless tobacco: Never Used  Substance Use Topics  . Alcohol use: Yes    Comment: twice weekly/ last 5/18  . Drug use: No    Comment: last use July/18   Review of Systems Constitutional:  Negative for fever. Cardiovascular: Negative for chest pain. Respiratory: Negative for shortness of breath. Gastrointestinal: Negative for abdominal pain, vomiting and diarrhea. Musculoskeletal: Negative for back pain. Skin: Positive for right wrist laceration Neurological: Negative for headaches, focal weakness or numbness. Psychiatric: Positive for depressed mood, self cutting  All systems negative/normal/unremarkable except as stated in the HPI  ____________________________________________   PHYSICAL EXAM:  VITAL SIGNS: ED Triage Vitals  Enc Vitals Group     BP 03/31/19 1143 120/69     Pulse Rate 03/31/19 1143 76     Resp 03/31/19 1143 16     Temp 03/31/19 1143 98.2 F (36.8 C)     Temp Source 03/31/19 1143 Oral     SpO2 03/31/19 1143 98 %     Weight 03/31/19 1144 200 lb (90.7 kg)     Height 03/31/19 1144 5\' 5"  (1.651 m)     Head Circumference --      Peak Flow --      Pain Score 03/31/19 1144 0     Pain Loc --      Pain Edu? --      Excl. in [place]? --     Constitutional: Alert and oriented. Well appearing and in no distress. Eyes: Conjunctivae are normal. Normal extraocular movements. Cardiovascular: Normal rate, regular rhythm. No murmurs, rubs, or gallops. Respiratory: Normal respiratory effort without tachypnea nor retractions. Breath sounds are clear and equal bilaterally. No wheezes/rales/rhonchi. Gastrointestinal: Soft and nontender. Normal bowel sounds Musculoskeletal: Tenderness over the  right distal forearm laceration Neurologic:  Normal speech and language. No gross focal neurologic deficits are appreciated.  Skin: 6 centimeter laceration is noted into the subcutaneous fat over the volar aspect of the distal right forearm.  2 other superficial lacerations are noted Psychiatric: Depressed mood and affect  ____________________________________________  ED COURSE:  As part of my medical decision making, I reviewed the following data within the electronic  MEDICAL RECORD NUMBER History obtained from family if available, nursing notes, old chart and ekg, as well as notes from prior ED visits. Patient presented for depression with self-harm, we will assess with labs  as indicated at this time.   Marland Kitchen..Laceration Repair  Date/Time: 03/31/2019 1:22 PM Performed by: Emily FilbertWilliams, Rhianna Raulerson E, MD Authorized by: Emily FilbertWilliams, Anetta Olvera E, MD   Consent:    Consent obtained:  Emergent situation   Consent given by:  Patient Anesthesia (see MAR for exact dosages):    Anesthesia method:  None Laceration details:    Location:  Shoulder/arm   Shoulder/arm location:  R lower arm   Length (cm):  6   Depth (mm):  4 Repair type:    Repair type:  Simple Treatment:    Irrigation solution:  Sterile saline Skin repair:    Repair method:  Tissue adhesive Approximation:    Approximation:  Close Post-procedure details:    Dressing:  Open (no dressing)   Patient tolerance of procedure:  Tolerated well, no immediate complications    [name] was evaluated in Emergency Department on 03/31/2019 for the symptoms described in the history of present illness. She was evaluated in the context of the global COVID-19 pandemic, which necessitated consideration that the patient might be at risk for infection with the SARS-CoV-2 virus that causes COVID-19. Institutional protocols and algorithms that pertain to the evaluation of patients at risk for COVID-19 are in a state of rapid change based on information released by regulatory bodies including the CDC and federal and state organizations. These policies and algorithms were followed during the patient's care in the ED.  ____________________________________________   LABS (pertinent positives/negatives)  Labs Reviewed  COMPREHENSIVE METABOLIC PANEL - Abnormal; Notable for the following components:      Result Value   Glucose, Bld 107 (*)    Calcium 8.6 (*)    All other components within normal limits  ACETAMINOPHEN LEVEL -  Abnormal; Notable for the following components:   Acetaminophen (Tylenol), Serum <10 (*)    All other components within normal limits  ETHANOL  SALICYLATE LEVEL  CBC  URINE DRUG SCREEN, QUALITATIVE ([facility] ONLY)  POC URINE PREG, ED  ____________________________________________   DIFFERENTIAL DIAGNOSIS   Depression, cutting, suicidal ideation  FINAL ASSESSMENT AND PLAN  Suicidal ideation, cutting   Plan: The patient had presented for depressed mood with self-harm. Patient's labs did not reveal any acute process.  This was a large laceration with several other superficial lacerations around the right wrist.  Wound was repaired as dictated above.  She is medically cleared for psychiatric evaluation and disposition.   Ulice DashJohnathan E Kaileia Flow, MD    Note: This note was generated in part or whole with voice recognition software. Voice recognition is usually quite accurate but there are transcription errors that can and very often do occur. I apologize for any typographical errors that were not detected and corrected.     Emily FilbertWilliams, Lessie Funderburke E, MD 03/31/19 1323

## 2019-03-31 NOTE — ED Notes (Signed)
Belongings include : Boots, licence, cell phone, underwear, bra, two shirts, socks, hair band.

## 2019-03-31 NOTE — ED Notes (Signed)
Report to include Situation, Background, Assessment, and Recommendations received from [facility]. Patient alert and oriented, warm and dry, in no acute distress. Patient denies SI, HI, AVH and pain. Patient is here for SIB on wrist. Patient made aware of Q15 minute rounds and Rover and Officer presence for their safety. Patient instructed to come to me with needs or concerns.

## 2019-03-31 NOTE — Consult Note (Signed)
[facility]   Reason for Consult:  Suicidal ideations and cutting Referring Physician:  EDP Patient Identification: Samantha Stallsatalie N Granberry MRN:  [id number] Principal Diagnosis: <principal problem not specified> Diagnosis:  Active Problems:   * No active hospital problems. *   Total Time spent with patient: 30 minutes  Subjective:   Samantha Mendez is a 28 y.o. female patient reports that today she just had some bad thoughts and she impulsively cut her right wrist.  She states that she does have a history of depression and cutting.  She states that there was no specific trigger today.  She reports that when she was a teenager she had attempted to kill herself at age 28 and as a teenager she also had multiple self-injurious behaviors of cutting her arms.  She continues to state that she does not need to be here and that she wants to go home and take care of her kids.  She states that her mother has her children a lot now, but her mother has some mobility issues and she is afraid her mother will not be able to take care of her children.  She states that now she is fine and she does not feel like killing herself.  She denies any homicidal ideations and denies any hallucinations.  Patient's mother, Misty StanleyLisa, was contacted for collateral information.  She states that the patient lives at the house with her and her children and that this morning when she had cut herself she would not show her wrist and only told the mother that she needed to come to the hospital to get her arm checked out.  She states that she feels that her daughter needs to be stabilized and is okay with the patient being hospitalized for stabilization.  She reports that she does have some mobility issues but will manage on taking care of the children to ensure that her daughter get some help and is stabilized before she comes home.  She stated that she was unsure of exactly what it happened to cause her to cut herself and  suspected the patient had an argument with her boyfriend, but was unsure of this.  HPI:  Per Dr. Mayford KnifeWilliams: 28 y.o. female with a history of asthma, migraines, substance abuse, depression who presents to the ED for she cut her right wrist.  Patient reports this was not suicidal action but rather to feel pain.  Patient states she does having a bad day.  She states she is been cutting and performing self-harm since she was a teenager but the past month she has been having SI.  Occasional alcohol abuse.  Patient is seen by this provider face-to-face.  Patient is currently denying any suicidal or homicidal ideations and denies any hallucinations.  Patient is extremely tearful and upset after she was told that it was preferred for her to be admitted for stabilization.  Patient has a deep laceration on her right wrist and this was a significant attempt.  The patient was stating that it was impulsive with no significant trigger which is concerning.  Patient had reported no medications for 6 years and had not been hospitalized since she was a teenager.  Feel that the patient needs to be admitted for inpatient treatment for medication management and stabilization before discharging home with her children.  Past Psychiatric History: Hospitalization when she was a teenager, suicide attempt when she was [age], no medications for 6 years  Risk to Self:   Risk to Others:  Prior Inpatient Therapy:   Prior Outpatient Therapy:    Past Medical History:  Past Medical History:  Diagnosis Date  . Asthma   . Back pain   . Migraine   . Sciatic leg pain     Past Surgical History:  Procedure Laterality Date  . ADENOIDECTOMY    . ADENOIDECTOMY    . MYRINGOTOMY WITH TUBE PLACEMENT    . TONSILLECTOMY     Family History: History reviewed. No pertinent family history. Family Psychiatric  History: None reported Social History:  Social History   Substance and Sexual Activity  Alcohol Use Yes   Comment: twice weekly/  last 5/18     Social History   Substance and Sexual Activity  Drug Use No   Comment: last use July/18    Social History   Socioeconomic History  . Marital status: Married    Spouse name: Not on file  . Number of children: Not on file  . Years of education: Not on file  . Highest education level: Not on file  Occupational History  . Not on file  Social Needs  . Financial resource strain: Not on file  . Food insecurity    Worry: Not on file    Inability: Not on file  . Transportation needs    Medical: Not on file    Non-medical: Not on file  Tobacco Use  . Smoking status: Current Every Day Smoker    Packs/day: 0.50    Types: Cigarettes  . Smokeless tobacco: Never Used  Substance and Sexual Activity  . Alcohol use: Yes    Comment: twice weekly/ last 5/18  . Drug use: No    Comment: last use July/18  . Sexual activity: Yes    Birth control/protection: I.U.D.    Comment: skyla  Lifestyle  . Physical activity    Days per week: Not on file    Minutes per session: Not on file  . Stress: Not on file  Relationships  . Social Musicianconnections    Talks on phone: Not on file    Gets together: Not on file    Attends religious service: Not on file    Active member of club or organization: Not on file    Attends meetings of clubs or organizations: Not on file    Relationship status: Not on file  Other Topics Concern  . Not on file  Social History Narrative  . Not on file   Additional Social History:    Allergies:   Allergies  Allergen Reactions  . Ceclor [Cefaclor] Hives  . Keflex [Cephalexin] Hives  . Cephalosporins Itching  . Latex Rash  . Omnicef [Cefdinir] Rash  . Sulfa Antibiotics Itching    Labs:  Results for orders placed or performed during the hospital encounter of 03/31/19 (from the past 48 hour(s))  Comprehensive metabolic panel     Status: Abnormal   Collection Time: 03/31/19 11:54 AM  Result Value Ref Range   Sodium 135 135 - 145 mmol/L   Potassium  3.7 3.5 - 5.1 mmol/L   Chloride 107 98 - 111 mmol/L   CO2 22 22 - 32 mmol/L   Glucose, Bld 107 (H) 70 - 99 mg/dL   BUN 12 6 - 20 mg/dL   Creatinine, Ser 1.610.75 0.44 - 1.00 mg/dL   Calcium 8.6 (L) 8.9 - 10.3 mg/dL   Total Protein 7.2 6.5 - 8.1 g/dL   Albumin 4.3 3.5 - 5.0 g/dL   AST 16 15 - 41  U/L   ALT 12 0 - 44 U/L   Alkaline Phosphatase 46 38 - 126 U/L   Total Bilirubin 0.8 0.3 - 1.2 mg/dL   GFR calc non Af Amer >60 >60 mL/min   GFR calc Af Amer >60 >60 mL/min   Anion gap 6 5 - 15    Comment: Performed at [facility], [address] [place], [place] [address]  Ethanol     Status: None   Collection Time: 03/31/19 11:54 AM  Result Value Ref Range   Alcohol, Ethyl (B) <10 <10 mg/dL    Comment: (NOTE) Lowest detectable limit for serum alcohol is 10 mg/dL. For medical purposes only. Performed at [facility], [address] [place], [place] [address]   Salicylate level     Status: None   Collection Time: 03/31/19 11:54 AM  Result Value Ref Range   Salicylate Lvl <7.0 2.8 - 30.0 mg/dL    Comment: Performed at [facility], [address] [place], [place] [address]  Acetaminophen level     Status: Abnormal   Collection Time: 03/31/19 11:54 AM  Result Value Ref Range   Acetaminophen (Tylenol), Serum <10 (L) 10 - 30 ug/mL    Comment: (NOTE) Therapeutic concentrations vary significantly. A range of 10-30 ug/mL  may be an effective concentration for many patients. However, some  are best treated at concentrations outside of this range. Acetaminophen concentrations >150 ug/mL at 4 hours after ingestion  and >50 ug/mL at 12 hours after ingestion are often associated with  toxic reactions. Performed at [facility], [address] [place], [place] [address]   cbc     Status: None   Collection Time: 03/31/19 11:54 AM  Result Value Ref Range   WBC 8.2 4.0 - 10.5 K/uL   RBC 4.66 3.87 - 5.11 MIL/uL   Hemoglobin 14.8 12.0 - 15.0 g/dL   HCT  29.543.1 62.136.0 - 30.846.0 %   MCV 92.5 80.0 - 100.0 fL   MCH 31.8 26.0 - 34.0 pg   MCHC 34.3 30.0 - 36.0 g/dL   RDW 65.713.7 84.611.5 - 96.215.5 %   Platelets 187 150 - 400 K/uL   nRBC 0.0 0.0 - 0.2 %    Comment: Performed at [facility], [address] [place], [place] [address]    No current facility-administered medications for this encounter.    Current Outpatient Medications  Medication Sig Dispense Refill  . albuterol (PROVENTIL HFA;VENTOLIN HFA) 108 (90 Base) MCG/ACT inhaler Inhale 2 puffs into the lungs every 6 (six) hours as needed for wheezing.     . chlorpheniramine-HYDROcodone (TUSSIONEX PENNKINETIC ER) 10-8 MG/5ML SUER Take 5 mLs by mouth at bedtime as needed for cough. (Patient not taking: Reported on 03/31/2019) 35 mL 0  . Dextromethorphan-Guaifenesin (MUCINEX DM) 30-600 MG TB12 Take 1 tablet by mouth 2 (two) times daily as needed. (Patient not taking: Reported on 03/31/2019) 28 each 0  . methocarbamol (ROBAXIN-750) 750 MG tablet Take 1 tablet (750 mg total) by mouth every 6 (six) hours as needed for muscle spasms. (Patient not taking: Reported on 03/31/2019) 30 tablet 0  . naproxen (NAPROSYN) 500 MG tablet Take 1 tablet (500 mg total) by mouth 2 (two) times daily with a meal. (Patient not taking: Reported on 03/31/2019) 30 tablet 0  . Prenatal Vit-Fe Fumarate-FA (PRENATAL MULTIVITAMIN) TABS tablet Take 1 tablet by mouth daily at 12 noon.      Musculoskeletal: Strength & Muscle Tone: within normal limits Gait &  Station: normal Patient leans: N/A  Psychiatric Specialty Exam: Physical Exam  Nursing note and vitals reviewed. Constitutional: She is oriented to person, place, and time. She appears well-developed and well-nourished.  Cardiovascular: Normal rate.  Respiratory: Effort normal.  Musculoskeletal: Normal range of motion.  Neurological: She is alert and oriented to person, place, and time.  Skin:  Deep laceration on right wrist    Review of Systems  Constitutional:  Negative.   HENT: Negative.   Eyes: Negative.   Respiratory: Negative.   Cardiovascular: Negative.   Gastrointestinal: Negative.   Genitourinary: Negative.   Musculoskeletal: Negative.   Neurological: Negative.   Endo/Heme/Allergies: Negative.   Psychiatric/Behavioral: Positive for depression and suicidal ideas. The patient is nervous/anxious.     Blood pressure 120/69, pulse 76, temperature 98.2 F (36.8 C), temperature source Oral, resp. rate 16, height 5\' 5"  (1.651 m), weight 90.7 kg, last menstrual period 03/31/2019, SpO2 98 %, unknown if currently breastfeeding.Body mass index is 33.28 kg/m.  General Appearance: Disheveled  Eye Contact:  Minimal  Speech:  Clear and Coherent and Normal Rate  Volume:  Decreased  Mood:  Depressed and Dysphoric  Affect:  Depressed and Tearful  Thought Process:  Linear and Descriptions of Associations: Intact  Orientation:  Full (Time, Place, and Person)  Thought Content:  WDL  Suicidal Thoughts:  Attempted suicide but denying current SI  Homicidal Thoughts:  No  Memory:  Immediate;   Good Recent;   Good Remote;   Good  Judgement:  Fair  Insight:  Lacking  Psychomotor Activity:  Normal  Concentration:  Concentration: Fair  Recall:  Good  Fund of Knowledge:  Good  Language:  Good  Akathisia:  No  Handed:    AIMS (if indicated):     Assets:  Communication Skills Desire for Improvement Financial Resources/Insurance Housing Resilience Social Support Transportation  ADL's:  Intact  Cognition:  WNL  Sleep:        Treatment Plan Summary: Daily contact with patient to assess and evaluate symptoms and progress in treatment and Medication management  Disposition: Recommend psychiatric Inpatient admission when medically cleared.  [place], FNP 03/31/2019 2:34 PM

## 2019-03-31 NOTE — ED Notes (Addendum)
Patient assigned to appropriate care area   Introduced self to pt  Patient oriented to unit/care area: Informed that, for their safety, care areas are designed for safety and visiting and phone hours explained to patient. Patient verbalizes understanding, and verbal contract for safety obtained  Environment secured    Patient is here due to cutting her right wrist. Pt reports this was not a suicidal action but rather to feel pain. PT has been cutting and performing self harm since she was a teenager but for the past month has been having SI. No HI or hallucinations. No drug use. Occasional alcohol use.

## 2019-03-31 NOTE — ED Notes (Signed)
BEHAVIORAL HEALTH ROUNDING Patient sleeping: No. Patient alert and oriented: yes Behavior appropriate: Yes.  ; If no, describe:  Nutrition and fluids offered: yes Toileting and hygiene offered: Yes  Sitter present: q15 minute observations and security monitoring Law enforcement present: Yes    

## 2019-03-31 NOTE — ED Triage Notes (Signed)
PT to ED after cutting her right wrist. Pt reports this was not a suicidal action but rather to feel pain. PT has been cutting and performing self harm since she was a teenager but for the past month has been having SI. No HI or hallucinations. No drug use. Occasional alcohol use.

## 2019-03-31 NOTE — ED Notes (Signed)
Patient spoke with TTS Kerry Dory and psych NP Darnelle Maffucci

## 2019-03-31 NOTE — ED Notes (Signed)
Patient has been very tearful off and on today, patient wants to go back home
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
## 2019-03-31 NOTE — ED Notes (Signed)
Pt mom Shealyn Sean at [phone] called for an update on patient. Will have pt call her back.

## 2019-04-01 ENCOUNTER — Inpatient Hospital Stay
Admission: AD | Admit: 2019-04-01 | Discharge: 2019-04-03 | DRG: 885 | Disposition: A | Discharge status: Home / Self Care | Payer: Medicaid Other | Source: Intra-hospital | Attending: Psychiatry | Admitting: Psychiatry

## 2019-04-01 ENCOUNTER — Other Ambulatory Visit: Payer: Self-pay

## 2019-04-01 DIAGNOSIS — Z818 Family history of other mental and behavioral disorders: Secondary | ICD-10-CM

## 2019-04-01 DIAGNOSIS — F39 Unspecified mood [affective] disorder: Secondary | ICD-10-CM | POA: Diagnosis present

## 2019-04-01 DIAGNOSIS — F332 Major depressive disorder, recurrent severe without psychotic features: Secondary | ICD-10-CM | POA: Diagnosis present

## 2019-04-01 DIAGNOSIS — Z915 Personal history of self-harm: Secondary | ICD-10-CM

## 2019-04-01 DIAGNOSIS — R45851 Suicidal ideations: Secondary | ICD-10-CM | POA: Diagnosis present

## 2019-04-01 DIAGNOSIS — F17210 Nicotine dependence, cigarettes, uncomplicated: Secondary | ICD-10-CM | POA: Diagnosis present

## 2019-04-01 DIAGNOSIS — J45909 Unspecified asthma, uncomplicated: Secondary | ICD-10-CM | POA: Diagnosis present

## 2019-04-01 LAB — SARS CORONAVIRUS 2 BY RT PCR (HOSPITAL ORDER, PERFORMED IN ~~LOC~~ HOSPITAL LAB): SARS Coronavirus 2: NEGATIVE

## 2019-04-01 MED ORDER — ACETAMINOPHEN 500 MG PO TABS
1000.0000 mg | ORAL_TABLET | Freq: Once | ORAL | Status: AC
  Administered 2019-04-01: 1000 mg via ORAL
  Filled 2019-04-01: qty 2

## 2019-04-01 MED ORDER — MAGNESIUM HYDROXIDE 400 MG/5ML PO SUSP: 30.0000 mL | Freq: Every day | ORAL | Status: DC | PRN

## 2019-04-01 MED ORDER — ALUM & MAG HYDROXIDE-SIMETH 200-200-20 MG/5ML PO SUSP: 30.0000 mL | ORAL | Status: DC | PRN

## 2019-04-01 MED ORDER — IBUPROFEN 600 MG PO TABS
600.0000 mg | ORAL_TABLET | Freq: Once | ORAL | Status: AC
  Administered 2019-04-01: 09:00:00 600 mg via ORAL
  Filled 2019-04-01: qty 1

## 2019-04-01 MED ORDER — KETOROLAC TROMETHAMINE 30 MG/ML IJ SOLN
INTRAMUSCULAR | Status: AC
  Administered 2019-04-01: 15 mg via INTRAMUSCULAR
  Filled 2019-04-01: qty 1

## 2019-04-01 MED ORDER — ACETAMINOPHEN 325 MG PO TABS
650.0000 mg | ORAL_TABLET | Freq: Four times a day (QID) | ORAL | Status: DC | PRN
  Administered 2019-04-02: 20:00:00 650 mg via ORAL
  Filled 2019-04-01 (×2): qty 2

## 2019-04-01 MED ORDER — KETOROLAC TROMETHAMINE 30 MG/ML IJ SOLN
15.0000 mg | Freq: Once | INTRAMUSCULAR | Status: AC
  Administered 2019-04-01: 15 mg via INTRAMUSCULAR

## 2019-04-01 NOTE — ED Notes (Signed)
Hourly rounding reveals patient in room. No complaints, stable, in no acute distress. Q15 minute rounds and monitoring via Rover and Officer to continue.   

## 2019-04-01 NOTE — ED Notes (Signed)
Pt sitting in the doorway of her room - pt encouraged to place a mask on her face since she is sitting into the hallway  - she refused  Door barrier placed by tech   NAD observed

## 2019-04-01 NOTE — BH Assessment (Signed)
Patient is to be admitted to [facility] by Psychiatric Nurse Practitioner Waylan Boga.  Attending Physician will be Dr. Weber Cooks.   Patient has been assigned to room 303, by [place].   ER staff is aware of the admission:  Lattie Haw, ER Secretary    Dr. Joan Mayans, ER MD   Amy T., Patient's Nurse   Vonna Kotyk, Patient Access.

## 2019-04-01 NOTE — ED Notes (Signed)
She has completed her shower

## 2019-04-01 NOTE — ED Notes (Signed)
BEHAVIORAL HEALTH ROUNDING Patient sleeping: No. Patient alert and oriented: yes Behavior appropriate: Yes.  ; If no, describe:  Nutrition and fluids offered: yes Toileting and hygiene offered: Yes  Sitter present: q15 minute observations  

## 2019-04-01 NOTE — Tx Team (Signed)
Initial Treatment Plan 04/01/2019 6:19 PM Samantha Mendez HFW:[id number]    PATIENT STRESSORS: Financial difficulties Marital or family conflict   PATIENT STRENGTHS: Communication skills Physical Health Supportive family/friends   PATIENT IDENTIFIED PROBLEMS: Depression   Anxiety                    DISCHARGE CRITERIA:  Ability to meet basic life and health needs Improved stabilization in mood, thinking, and/or behavior Verbal commitment to aftercare and medication compliance  PRELIMINARY DISCHARGE PLAN: Outpatient therapy Participate in family therapy Return to previous living arrangement  PATIENT/FAMILY INVOLVEMENT: This treatment plan has been presented to and reviewed with the patient, Samantha Mendez..  The patient and family have been given the opportunity to ask questions and make suggestions.  Kieth Brightly, RN 04/01/2019, 6:19 PM

## 2019-04-01 NOTE — Plan of Care (Signed)
Pt rates anxiety 2/10 and denies depression, anxiety, SI, HI and AVH. Pt has been educated on care plan and verbalizes understanding. Pt has been oriented to the unit. Collier Bullock RN Problem: Education: Goal: Utilization of techniques to improve thought processes will improve Outcome: Not Progressing Goal: Knowledge of the prescribed therapeutic regimen will improve Outcome: Not Progressing   Problem: Activity: Goal: Interest or engagement in leisure activities will improve Outcome: Not Progressing Goal: Imbalance in normal sleep/wake cycle will improve Outcome: Not Progressing   Problem: Coping: Goal: Coping ability will improve Outcome: Not Progressing Goal: Will verbalize feelings Outcome: Not Progressing   Problem: Health Behavior/Discharge Planning: Goal: Ability to make decisions will improve Outcome: Not Progressing Goal: Compliance with therapeutic regimen will improve Outcome: Not Progressing   Problem: Role Relationship: Goal: Will demonstrate positive changes in social behaviors and relationships Outcome: Not Progressing   Problem: Safety: Goal: Ability to disclose and discuss suicidal ideas will improve Outcome: Not Progressing Goal: Ability to identify and utilize support systems that promote safety will improve Outcome: Not Progressing   Problem: Self-Concept: Goal: Will verbalize positive feelings about self Outcome: Not Progressing Goal: Level of anxiety will decrease Outcome: Not Progressing   Problem: Education: Goal: Ability to state activities that reduce stress will improve Outcome: Not Progressing   Problem: Coping: Goal: Ability to identify and develop effective coping behavior will improve Outcome: Not Progressing   Problem: Self-Concept: Goal: Ability to identify factors that promote anxiety will improve Outcome: Not Progressing Goal: Level of anxiety will decrease Outcome: Not Progressing Goal: Ability to modify response to factors that  promote anxiety will improve Outcome: Not Progressing

## 2019-04-01 NOTE — ED Notes (Signed)
BEHAVIORAL HEALTH ROUNDING Patient sleeping: No. Patient alert and oriented: yes Behavior appropriate: Yes.  ; If no, describe:  Nutrition and fluids offered: yes Toileting and hygiene offered: Yes  Sitter present: q15 minute observations  ENVIRONMENTAL ASSESSMENT Potentially harmful objects out of patient reach: Yes.   Personal belongings secured: Yes.   Patient dressed in hospital provided attire only: Yes.   Plastic bags out of patient reach: Yes.   Patient care equipment (cords, cables, call bells, lines, and drains) shortened, removed, or accounted for: Yes.   Equipment and supplies removed from bottom of stretcher: Yes.   Potentially toxic materials out of patient reach: Yes.   Sharps container removed or out of patient reach: Yes.   

## 2019-04-01 NOTE — ED Notes (Signed)
ED Is the patient under IVC or is there intent for IVC: Yes.   Is the patient medically cleared: Yes.   Is there vacancy in the ED BHU: unit closed Is the population mix appropriate for patient:  Is the patient awaiting placement in inpatient or outpatient setting: Yes.  She has been waiting for an inpatient bed to be assigned  Has the patient had a psychiatric consult: Yes.   Survey of unit performed for contraband, proper placement and condition of furniture, tampering with fixtures in bathroom, shower, and each patient room: Yes.  ; Findings:  APPEARANCE/BEHAVIOR Calm and cooperative NEURO ASSESSMENT Orientation: oriented x3   Hallucinations: No.None noted (Hallucinations)  denies Speech: Normal Gait: normal RESPIRATORY ASSESSMENT Even  Unlabored respirations  CARDIOVASCULAR ASSESSMENT Pulses equal   regular rate  Skin warm and dry   GASTROINTESTINAL ASSESSMENT no GI complaint EXTREMITIES Full ROM  PLAN OF CARE Provide calm/safe environment. Vital signs assessed twice daily. ED BHU Assessment once each 12-hour shift. Collaborate with TTS daily or as condition indicates. Assure the ED provider has rounded once each shift. Provide and encourage hygiene. Provide redirection as needed. Assess for escalating behavior; address immediately and inform ED provider.  Assess family dynamic and appropriateness for visitation as needed: Yes.  ; If necessary, describe findings:  Educate the patient/family about BHU procedures/visitation: Yes.  ; If necessary, describe findings:

## 2019-04-01 NOTE — ED Notes (Signed)
She is transferring to [facility] to room 303

## 2019-04-02 DIAGNOSIS — F332 Major depressive disorder, recurrent severe without psychotic features: Principal | ICD-10-CM

## 2019-04-02 MED ORDER — TRAZODONE HCL 50 MG PO TABS
50.0000 mg | ORAL_TABLET | Freq: Every evening | ORAL | Status: DC | PRN
  Administered 2019-04-02: 22:00:00 50 mg via ORAL

## 2019-04-02 MED ORDER — IBUPROFEN 600 MG PO TABS
600.0000 mg | ORAL_TABLET | Freq: Four times a day (QID) | ORAL | Status: DC | PRN
  Administered 2019-04-02 – 2019-04-03 (×2): 600 mg via ORAL
  Filled 2019-04-02 (×2): qty 1

## 2019-04-02 MED ORDER — HYDROXYZINE HCL 25 MG PO TABS: 25.0000 mg | ORAL_TABLET | Freq: Three times a day (TID) | ORAL | Status: DC | PRN

## 2019-04-02 MED ORDER — RISPERIDONE 0.25 MG PO TABS
0.2500 mg | ORAL_TABLET | Freq: Every day | ORAL | Status: DC
  Administered 2019-04-02: 21:00:00 0.25 mg via ORAL
  Filled 2019-04-02: qty 1

## 2019-04-02 NOTE — Progress Notes (Signed)
D: Patient has been pleasant and cooperative. Denies SI, HI and AVH. Mood is sad. Affect is appropriate to circumstance. Wants to be discharged. Contracts for safety A: Continue to monitor for safety R: Safety maintained.
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
## 2019-04-02 NOTE — Plan of Care (Signed)
  Problem: Activity: Goal: Interest or engagement in leisure activities will improve Outcome: Progressing Goal: Imbalance in normal sleep/wake cycle will improve Outcome: Progressing  D: Patient has been pleasant and cooperative. Denies SI, HI and AVH. Mood is sad. Affect is appropriate to circumstance. Wants to be discharged. Contracts for safety A: Continue to monitor for safety R: Safety maintained.
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
## 2019-04-02 NOTE — B[facility] Group Notes (Signed)
[facility] LCSW Group Therapy Note    Date/Time: 04/02/2019 12:50PM   Type of Therapy and Topic: Group Therapy: Trust and Honesty    Participation Level:  Active   Description of Group:  In this group patients will be asked to explore value of being honest. Patients will be guided to discuss their thoughts, feelings, and behaviors related to honesty and trusting in others. Patients will process together how trust and honesty relate to how we form relationships with peers, family members, and self. Each patient will be challenged to identify and express feelings of being vulnerable. Patients will discuss reasons why people are dishonest and identify alternative outcomes if one was truthful (to self or others). This group will be process-oriented, with patients participating in exploration of their own experiences as well as giving and receiving support and challenge from other group members.    Therapeutic Goals:  1. Patient will identify why honesty is important to relationships and how honesty overall affects relationships.  2. Patient will identify a situation where they lied or were lied too and the feelings, thought process, and behaviors surrounding the situation  3. Patient will identify the meaning of being vulnerable, how that feels, and how that correlates to being honest with self and others.  4. Patient will identify situations where they could have told the truth, but instead lied and explain reasons of dishonesty.    Summary of Patient Progress  Group members engaged in discussion on trust and honesty. Group members shared times where they have been dishonest or people have broken their trust and how the relationship was effected. Group members shared why people break trust, and the importance of trust in a relationship. Each group member shared a person in their life that they can trust. Patient participated in group discussion. She identified those who are part of her support system who she  can trust and will be able to be honest with about her thoughts and feelings after she discharges.   Therapeutic Modalities:  Cognitive Behavioral Therapy  Solution Focused Therapy  Motivational Interviewing  Brief Therapy     Netta Neat, MSW, LCSW Clinical Social Work

## 2019-04-03 MED ORDER — RISPERIDONE 0.5 MG PO TABS: 0.5000 mg | ORAL_TABLET | Freq: Every day | ORAL | 0 refills | Status: AC

## 2019-04-03 MED ORDER — TRAZODONE HCL 50 MG PO TABS: 50.0000 mg | ORAL_TABLET | Freq: Every evening | ORAL | 0 refills | Status: AC | PRN

## 2019-04-03 MED ORDER — RISPERIDONE 1 MG PO TABS: 0.5000 mg | ORAL_TABLET | Freq: Every day | ORAL | Status: DC

## 2019-04-03 MED ORDER — HYDROXYZINE HCL 25 MG PO TABS: 25.0000 mg | ORAL_TABLET | Freq: Four times a day (QID) | ORAL | 0 refills | Status: AC | PRN

## 2019-04-03 NOTE — Plan of Care (Signed)
Patient has been evaluated by MD and discharged ordered. Will bed discharged in PM once transportation available.
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
## 2019-04-03 NOTE — BHH Group Notes (Signed)
LCSW Group Therapy Note 04/03/2019  1:00 PM   Type of Therapy and Topic: Group Therapy: Feelings Around Returning Home & Establishing a Supportive Framework and Supporting Oneself When Supports Not Available   Participation Level: Active   Description of Group:  Patients first processed thoughts and feelings about upcoming discharge. These included fears of upcoming changes, lack of change, new living environments, judgements and expectations from others and overall stigma of mental health issues. The group then discussed the definition of a supportive framework, what that looks and feels like, and how do to discern it from an unhealthy non-supportive network. The group identified different types of supports as well as what to do when your family/friends are less than helpful or unavailable   Therapeutic Goals  1. Patient will identify one healthy supportive network that they can use at discharge. 2. Patient will identify one factor of a supportive framework and how to tell it from an unhealthy network. 3. Patient able to identify one coping skill to use when they do not have positive supports from others. 4. Patient will demonstrate ability to communicate their needs through discussion and/or role plays.   Summary of Patient Progress:  Patient was an active and supportive participant in group.  Patient shared with others her concerns for being discharged as well as how her family has responded to her mental health issues.  Patient shared that she has used coping skills effectively in the past and reports a belief that she can do so at discharge.       Therapeutic Modalities Cognitive Behavioral Therapy Motivational Interviewing  Assunta Curtis, MSW, LCSW 04/03/2019 10:37 AM

## 2019-04-03 NOTE — Progress Notes (Signed)
D: Patient has been calm and cooperative. Denies SI, HI and AVH. Contracts for safety. Mood is pleasant. Affect is appropriate to circumstance.  A: Continue to monitor for safety R:  Safety maintained.

## 2019-04-03 NOTE — Progress Notes (Addendum)
[facility]  04/03/2019 9:28 AM Samantha Mendez  MRN:  [id number] Subjective: Patient is a 28 year old female with a past psychiatric history significant for depression who was admitted on 04/02/2019 secondary to worsening depression and suicidal ideation.  Objective: Patient is seen and examined.  Patient is a 28 year old female with the above-stated past psychiatric history who is seen in follow-up.  Since her admission her main focus is been on being able to get out of the hospital in time to go get this cyst taken off her posterior right hand.  She stated that they had previously attempted to drain it as well as injected with steroids and there is still problems.  She is having significant nerve pain secondary to that.  She denied any suicidal ideation.  She stated that she slept better last night with the Risperdal, but it was not as sedating as it had been to her in the past, and she took the trazodone.  She stated she felt well this morning.  She denied any suicidal or homicidal ideation.  She denied any side effects to her current medications.  She denied any auditory or visual hallucinations.  Her vital signs are stable, she is afebrile.  Nursing notes reflect that she slept 5.5 hours.  Principal Problem: <principal problem not specified> Diagnosis: Active Problems:   Major depressive disorder, recurrent severe without psychotic features (HCC)  Total Time spent with patient: 20 minutes  Past Psychiatric History: See admission H&P  Past Medical History:  Past Medical History:  Diagnosis Date  . Asthma   . Back pain   . Migraine   . Sciatic leg pain     Past Surgical History:  Procedure Laterality Date  . ADENOIDECTOMY    . ADENOIDECTOMY    . MYRINGOTOMY WITH TUBE PLACEMENT    . TONSILLECTOMY     Family History: History reviewed. No pertinent family history. Family Psychiatric  History: See admission H&P Social History:  Social History   Substance and Sexual Activity   Alcohol Use Yes   Comment: twice weekly/ last 5/18     Social History   Substance and Sexual Activity  Drug Use No   Comment: last use July/18    Social History   Socioeconomic History  . Marital status: Married    Spouse name: Not on file  . Number of children: Not on file  . Years of education: Not on file  . Highest education level: Not on file  Occupational History  . Not on file  Social Needs  . Financial resource strain: Not on file  . Food insecurity    Worry: Not on file    Inability: Not on file  . Transportation needs    Medical: Not on file    Non-medical: Not on file  Tobacco Use  . Smoking status: Current Every Day Smoker    Packs/day: 0.50    Types: Cigarettes  . Smokeless tobacco: Never Used  Substance and Sexual Activity  . Alcohol use: Yes    Comment: twice weekly/ last 5/18  . Drug use: No    Comment: last use July/18  . Sexual activity: Yes    Birth control/protection: I.U.D.    Comment: skyla  Lifestyle  . Physical activity    Days per week: Not on file    Minutes per session: Not on file  . Stress: Not on file  Relationships  . Social connections    Talks on phone: Not on file  Gets together: Not on file    Attends religious service: Not on file    Active member of club or organization: Not on file    Attends meetings of clubs or organizations: Not on file    Relationship status: Not on file  Other Topics Concern  . Not on file  Social History Narrative  . Not on file   Additional Social History:                         Sleep: Fair  Appetite:  Good  Current Medications: Current Facility-Administered Medications  Medication Dose Route Frequency Provider Last Rate Last Dose  . acetaminophen (TYLENOL) tablet 650 mg  650 mg Oral Q6H PRN Charm RingsLord, Jamison Y, NP   650 mg at 04/02/19 1948  . alum & mag hydroxide-simeth (MAALOX/MYLANTA) 200-200-20 MG/5ML suspension 30 mL  30 mL Oral Q4H PRN Charm RingsLord, Jamison Y, NP      .  hydrOXYzine (ATARAX/VISTARIL) tablet 25 mg  25 mg Oral TID PRN Antonieta Pertlary,  Lawson, MD      . ibuprofen (ADVIL) tablet 600 mg  600 mg Oral Q6H PRN Antonieta Pertlary,  Lawson, MD   600 mg at 04/03/19 [id number]  . magnesium hydroxide (MILK OF MAGNESIA) suspension 30 mL  30 mL Oral Daily PRN Charm RingsLord, Jamison Y, NP      . risperiDONE (RISPERDAL) tablet 0.25 mg  0.25 mg Oral QHS Antonieta Pertlary,  Lawson, MD   0.25 mg at 04/02/19 2121  . traZODone (DESYREL) tablet 50 mg  50 mg Oral QHS PRN Antonieta Pertlary,  Lawson, MD   50 mg at 04/02/19 2229    Lab Results: No results found for this or any previous visit (from the past 48 hour(s)).  Blood Alcohol level:  Lab Results  Component Value Date   ETH <10 03/31/2019    Metabolic Disorder Labs: No results found for: HGBA1C, MPG No results found for: PROLACTIN No results found for: CHOL, TRIG, HDL, CHOLHDL, VLDL, LDLCALC  Physical Findings: AIMS:  , ,  ,  ,    CIWA:    COWS:     Musculoskeletal: Strength & Muscle Tone: within normal limits Gait & Station: normal Patient leans: N/A  Psychiatric Specialty Exam: Physical Exam  Nursing note and vitals reviewed. Constitutional: She is oriented to person, place, and time. She appears well-developed and well-nourished.  HENT:  Head: Normocephalic and atraumatic.  Respiratory: Effort normal.  Neurological: She is alert and oriented to person, place, and time.    ROS  Blood pressure (!) 112/49, pulse 71, temperature 98 F (36.7 C), temperature source Oral, resp. rate 18, height 5\' 5"  (1.651 m), weight 88.5 kg, last menstrual period 03/31/2019, SpO2 100 %, unknown if currently breastfeeding.Body mass index is 32.45 kg/m.  General Appearance: Casual  Eye Contact:  Good  Speech:  Normal Rate  Volume:  Normal  Mood:  Anxious  Affect:  Congruent  Thought Process:  Coherent and Descriptions of Associations: Intact  Orientation:  Full (Time, Place, and Person)  Thought Content:  Logical  Suicidal Thoughts:  No  Homicidal  Thoughts:  No  Memory:  Immediate;   Fair Recent;   Fair Remote;   Fair  Judgement:  Intact  Insight:  Fair  Psychomotor Activity:  Increased  Concentration:  Concentration: Fair and Attention Span: Fair  Recall:  FiservFair  Fund of Knowledge:  Fair  Language:  Fair  Akathisia:  Negative  Handed:  Right  AIMS (if indicated):  Assets:  Desire for Improvement Resilience  ADL's:  Intact  Cognition:  WNL  Sleep:  Number of Hours: 5.5     Treatment Plan Summary: Daily contact with patient to assess and evaluate symptoms and progress in treatment, Medication management and Plan : Patient is seen and examined.  Patient is a 28 year old female with the above-stated past psychiatric history who is seen in follow-up.   Diagnosis: #1 major depression versus unspecified mood disorder.  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  She denied any psychiatric complaints today.  She denied any suicidal ideation.  I will go on and write for her to be n.p.o. after midnight tonight, and we will go on and process her discharge to be able to get her out of the hospital early to make her surgical appointment at 10 AM at the [facility].  She denied any suicidal or homicidal ideation.  She denied any auditory or visual hallucinations.  I will go on and increase her Risperdal up to 0.5 mg p.o. nightly to see if we can improve her sleep tonight. 1.  Continue hydroxyzine 25 mg p.o. 3 times daily as needed itching, or anxiety. 2.  Continue ibuprofen 600 mg p.o. every 6 hours for headache or pain. 3.  Increase Risperdal to 0.5 mg p.o. nightly for mood stability. 4.  Continue trazodone 50 mg p.o. nightly as needed insomnia. 5.  Probable discharge in a.m. so that the patient can make her surgical outpatient appointment.   Addendum: After further discussion the patient would like to leave today so that she can be at home overnight and then make it to the surgeon's office and time  for her surgery.  She is not suicidal, homicidal or psychotic.  We will go on and discharge her home today. Sharma Covert, MD 04/03/2019, 9:28 AM

## 2019-04-03 NOTE — Progress Notes (Signed)
  [facility] Adult Case Management Discharge Plan :  Will you be returning to the same living situation after discharge:  Yes,  pt reports that she is returning home. At discharge, do you have transportation home?: Yes,  pt's mother will provide transportation. Do you have the ability to pay for your medications: No.  Release of information consent forms completed and in the chart;  Patient's signature needed at discharge.  Patient to Follow up at: Follow-up Information    [facility] Follow up.   Why: Please attend walk in hours M,W, F 8-4. Contact information: [place] [address] [phone]           Next level of care provider has access to [place] and Suicide Prevention discussed: No.  Patient is being discharged before SPE can be completed.      Has patient been referred to the Quitline?: Patient refused referral  Patient has been referred for addiction treatment: Pt. refused referral  Rozann Lesches, LCSW 04/03/2019, 3:08 PM

## 2019-04-03 NOTE — BHH Suicide Risk Assessment (Signed)
[place] INPATIENT:  Family/Significant Other Suicide Prevention Education  Suicide Prevention Education:  Patient Refusal for Family/Significant Other Suicide Prevention Education: The patient Samantha Mendez has refused to provide written consent for family/significant other to be provided Family/Significant Other Suicide Prevention Education during admission and/or prior to discharge.  Physician notified.  SPE completed with pt, as pt refused to consent to family contact. SPI pamphlet provided to pt and pt was encouraged to share information with support network, ask questions, and talk about any concerns relating to SPE. Pt denies access to guns/firearms and verbalized understanding of information provided. Mobile Crisis information also provided to pt.   Rozann Lesches 04/03/2019, 3:21 PM

## 2019-04-03 NOTE — Plan of Care (Signed)
  Problem: Education: Goal: Utilization of techniques to improve thought processes will improve Outcome: Progressing Goal: Knowledge of the prescribed therapeutic regimen will improve Outcome: Progressing  D: Patient has been calm and cooperative. Denies SI, HI and AVH. Contracts for safety. Mood is pleasant. Affect is appropriate to circumstance.  A: Continue to monitor for safety R:  Safety maintained.

## 2019-04-03 NOTE — BHH Suicide Risk Assessment (Signed)
[facility] Discharge Suicide Risk Assessment   Principal Problem: <principal problem not specified> Discharge Diagnoses: Active Problems:   Major depressive disorder, recurrent severe without psychotic features ([place])   Total Time spent with patient: 30 minutes  Musculoskeletal: Strength & Muscle Tone: within normal limits Gait & Station: normal Patient leans: N/A  Psychiatric Specialty Exam: Review of Systems  Musculoskeletal: Positive for joint pain.  All other systems reviewed and are negative.   Blood pressure (!) 112/49, pulse 71, temperature 98 F (36.7 C), temperature source Oral, resp. rate 18, height 5\' 5"  (1.651 m), weight 88.5 kg, last menstrual period 03/31/2019, SpO2 100 %, unknown if currently breastfeeding.Body mass index is 32.45 kg/m.  General Appearance: Casual  Eye Contact::  Good  Speech:  Normal Rate409  Volume:  Normal  Mood:  Euthymic  Affect:  Congruent  Thought Process:  Coherent and Descriptions of Associations: Intact  Orientation:  Full (Time, Place, and Person)  Thought Content:  Logical  Suicidal Thoughts:  No  Homicidal Thoughts:  No  Memory:  Immediate;   Good Recent;   Good Remote;   Good  Judgement:  Intact  Insight:  Fair  Psychomotor Activity:  Normal  Concentration:  Fair  Recall:  Good  Fund of Knowledge:Good  Language: Good  Akathisia:  Negative  Handed:  Right  AIMS (if indicated):     Assets:  Desire for Improvement Resilience  Sleep:  Number of Hours: 5.5  Cognition: WNL  ADL's:  Intact   Mental Status Per Nursing Assessment::   On Admission:  NA  Demographic Factors:  Caucasian  Loss Factors: NA  Historical Factors: Impulsivity  Risk Reduction Factors:   Responsible for children under 51 years of age, Sense of responsibility to family and Positive social support  Continued Clinical Symptoms:  Severe Anxiety and/or Agitation Depression:   Impulsivity  Cognitive Features That Contribute To Risk:  None     Suicide Risk:  Minimal: No identifiable suicidal ideation.  Patients presenting with no risk factors but with morbid ruminations; may be classified as minimal risk based on the severity of the depressive symptoms    Plan Of Care/Follow-up recommendations:  Activity:  ad lib  Sharma Covert, MD 04/03/2019, 11:21 AM

## 2019-04-03 NOTE — Progress Notes (Signed)
Patient discharged to current residency per MD order. Belongings and discharge instructions provided. Patient is willing to follow up at [facility].Marland Kitchen Denied suicidal thoughts upon discharge. No sign of distress.

## 2019-08-07 ENCOUNTER — Other Ambulatory Visit: Payer: Self-pay

## 2019-08-07 ENCOUNTER — Emergency Department: Payer: Medicaid Other

## 2019-08-07 ENCOUNTER — Encounter: Payer: Self-pay | Admitting: Emergency Medicine

## 2019-08-07 ENCOUNTER — Emergency Department
Admission: EM | Admit: 2019-08-07 | Discharge: 2019-08-07 | Disposition: A | Discharge status: Home / Self Care | Payer: Medicaid Other | Attending: Student | Admitting: Student

## 2019-08-07 DIAGNOSIS — Y999 Unspecified external cause status: Secondary | ICD-10-CM | POA: Insufficient documentation

## 2019-08-07 DIAGNOSIS — Z79899 Other long term (current) drug therapy: Secondary | ICD-10-CM | POA: Insufficient documentation

## 2019-08-07 DIAGNOSIS — S60221A Contusion of right hand, initial encounter: Secondary | ICD-10-CM | POA: Insufficient documentation

## 2019-08-07 DIAGNOSIS — Y929 Unspecified place or not applicable: Secondary | ICD-10-CM | POA: Diagnosis not present

## 2019-08-07 DIAGNOSIS — Y9389 Activity, other specified: Secondary | ICD-10-CM | POA: Diagnosis not present

## 2019-08-07 DIAGNOSIS — W2209XA Striking against other stationary object, initial encounter: Secondary | ICD-10-CM | POA: Insufficient documentation

## 2019-08-07 DIAGNOSIS — S6991XA Unspecified injury of right wrist, hand and finger(s), initial encounter: Secondary | ICD-10-CM | POA: Diagnosis present

## 2019-08-07 DIAGNOSIS — Z91040 Latex allergy status: Secondary | ICD-10-CM | POA: Diagnosis not present

## 2019-08-07 DIAGNOSIS — F17210 Nicotine dependence, cigarettes, uncomplicated: Secondary | ICD-10-CM | POA: Diagnosis not present

## 2019-08-07 DIAGNOSIS — J45909 Unspecified asthma, uncomplicated: Secondary | ICD-10-CM | POA: Insufficient documentation

## 2019-08-07 MED ORDER — MELOXICAM 15 MG PO TABS: 15.0000 mg | ORAL_TABLET | Freq: Every day | ORAL | 1 refills | Status: AC
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
## 2019-08-07 NOTE — ED Triage Notes (Signed)
Pt arrived via POV states she punched the refrigerator this morning around 11am. Swelling noted, painful to move fingers.

## 2019-08-11 ENCOUNTER — Other Ambulatory Visit: Payer: Self-pay

## 2019-08-11 ENCOUNTER — Ambulatory Visit (LOCAL_COMMUNITY_HEALTH_CENTER): Payer: Medicaid Other | Admitting: Physician Assistant

## 2019-08-11 ENCOUNTER — Encounter: Payer: Self-pay | Admitting: Physician Assistant

## 2019-08-11 VITALS — BP 100/61 | Ht 65.0 in | Wt 199.4 lb

## 2019-08-11 DIAGNOSIS — Z720 Tobacco use: Secondary | ICD-10-CM | POA: Diagnosis not present

## 2019-08-11 DIAGNOSIS — Z30013 Encounter for initial prescription of injectable contraceptive: Secondary | ICD-10-CM

## 2019-08-11 DIAGNOSIS — Z30432 Encounter for removal of intrauterine contraceptive device: Secondary | ICD-10-CM

## 2019-08-11 MED ORDER — MEDROXYPROGESTERONE ACETATE 150 MG/ML IM SUSP
150.0000 mg | Freq: Once | INTRAMUSCULAR | Status: AC
  Administered 2019-08-11: 150 mg via INTRAMUSCULAR

## 2019-08-11 NOTE — Progress Notes (Addendum)
Patient here today for FP services. Wants  to have IUD removed. Per [facility], last PE with IUD Insertion was 09/28/2017 and last Pap Smear (NIL results) was 01/28/2017 with next due 01/2020. Wants STD screening today. Would like to discuss Depo for alternate birth control. Hal Morales, RN

## 2019-08-11 NOTE — Progress Notes (Signed)
[place] problem visit  [place] Department  Subjective:  Samantha Mendez is a 29 y.o. being seen today for   Chief Complaint  Patient presents with  . Procedure    IUD removal    Pt here for Mirena IUD removal. Reports heavy prolonged bleeding since 07/24/19 and would like to switch to DMPA, which she has not used in the past.    Does the patient have a current or past history of drug use? No   No components found for: HCV]   Health Maintenance Due  Topic Date Due  . TETANUS/TDAP  12/27/2010  . PAP-Cervical Cytology Screening  12/26/2012  . PAP SMEAR-Modifier  12/26/2012  . INFLUENZA VACCINE  03/12/2019    Review of Systems  Constitutional: Negative.   Respiratory: Negative.   Cardiovascular: Negative.   Genitourinary:       See HPI.  Musculoskeletal: Negative.   Psychiatric/Behavioral: Negative.     The following portions of the patient's history were reviewed and updated as appropriate: allergies, current medications, past family history, past medical history, past social history, past surgical history and problem list. Problem list updated.   See flowsheet for other program required questions.  Objective:   Vitals:   08/11/19 1046  BP: 100/61  Weight: 199 lb 6.4 oz (90.4 kg)  Height: 5\' 5"  (1.651 m)    Physical Exam Constitutional:      Appearance: Normal appearance. She is obese.  Pulmonary:     Effort: Pulmonary effort is normal.  Genitourinary:    General: Normal vulva.     Pubic Area: No rash.      Labia:        Right: No rash, tenderness or lesion.        Left: No rash, tenderness or lesion.      Vagina: No signs of injury. No vaginal discharge, erythema, tenderness, bleeding or prolapsed vaginal walls.     Cervix: No discharge, friability, lesion, erythema or cervical bleeding.     Uterus: Not enlarged and not tender.      Adnexa:        Right: No mass or tenderness.         Left: No mass or tenderness.        Comments: IUD strings easily seen at cervical os. Lymphadenopathy:     Lower Body: No right inguinal adenopathy. No left inguinal adenopathy.  Neurological:     Mental Status: She is alert and oriented to person, place, and time.  Psychiatric:        Mood and Affect: Mood normal.        Behavior: Behavior normal.        Thought Content: Thought content normal.        Judgment: Judgment normal.     IUD Removal  Patient identified, informed consent performed, consent signed.  Patient was in the dorsal lithotomy position, normal external genitalia was noted.  A speculum was placed in the patient's vagina, normal discharge was noted, no lesions. The cervix was visualized, no lesions, no abnormal discharge.  The strings of the IUD were grasped and pulled using ring forceps. The IUD was removed in its entirety.  Patient tolerated the procedure well.    Patient will use DMPA for contraception/no plans for pregnancy soon and she was told to avoid teratogens, take PNV and folic acid.  Routine preventative health maintenance measures emphasized.    Assessment and Plan:  Samantha Mendez is  a 28 y.o. female presenting to the [facility] Department for a Women's Health problem visit  1. Encounter for initial prescription of injectable contraceptive After successful Mirena IUD removal, pt desires DPMA.  - Chlamydia/Gonorrhea [facility] - medroxyPROGESTERone (DEPO-PROVERA) injection 150 mg  2. Nexplanon removal Successful today.  3. Tobacco abuse Enc tobacco cessation - pt declines a this time.     Return in about 12 weeks (around 11/03/2019) for DMPA injection.  No future appointments.  Landry Dyke, PA-C

## 2019-08-11 NOTE — Progress Notes (Signed)
Depo given per provider orders. Hal Morales, RN

## 2019-10-09 ENCOUNTER — Emergency Department
Admission: EM | Admit: 2019-10-09 | Discharge: 2019-10-09 | Disposition: A | Discharge status: Home / Self Care | Payer: Medicaid Other | Attending: Emergency Medicine | Admitting: Emergency Medicine

## 2019-10-09 ENCOUNTER — Encounter: Payer: Self-pay | Admitting: Emergency Medicine

## 2019-10-09 ENCOUNTER — Emergency Department: Payer: Medicaid Other

## 2019-10-09 ENCOUNTER — Other Ambulatory Visit: Payer: Self-pay

## 2019-10-09 DIAGNOSIS — S93602A Unspecified sprain of left foot, initial encounter: Secondary | ICD-10-CM | POA: Insufficient documentation

## 2019-10-09 DIAGNOSIS — Y999 Unspecified external cause status: Secondary | ICD-10-CM | POA: Diagnosis not present

## 2019-10-09 DIAGNOSIS — M79672 Pain in left foot: Secondary | ICD-10-CM

## 2019-10-09 DIAGNOSIS — F17210 Nicotine dependence, cigarettes, uncomplicated: Secondary | ICD-10-CM | POA: Insufficient documentation

## 2019-10-09 DIAGNOSIS — S99922A Unspecified injury of left foot, initial encounter: Secondary | ICD-10-CM | POA: Diagnosis present

## 2019-10-09 DIAGNOSIS — Y929 Unspecified place or not applicable: Secondary | ICD-10-CM | POA: Diagnosis not present

## 2019-10-09 DIAGNOSIS — X58XXXA Exposure to other specified factors, initial encounter: Secondary | ICD-10-CM | POA: Diagnosis not present

## 2019-10-09 DIAGNOSIS — Y939 Activity, unspecified: Secondary | ICD-10-CM | POA: Insufficient documentation

## 2019-10-09 DIAGNOSIS — J45909 Unspecified asthma, uncomplicated: Secondary | ICD-10-CM | POA: Diagnosis not present

## 2019-10-09 MED ORDER — LIDOCAINE 5 % EX PTCH: 1.0000 | MEDICATED_PATCH | CUTANEOUS | 0 refills | Status: AC

## 2019-10-09 NOTE — Discharge Instructions (Addendum)
Your exam and XR are negative for any signs of a serious injury or fracture to the left foot. Wear the LidoDerm patches as needed. Take OTC ibuprofen as needed for pain and inflammation. Follow-up with [facility] for further management.

## 2019-10-09 NOTE — ED Notes (Signed)
Pt with c/o of left foot pain that started Friday. Pt states has been taking OTC meds but pain has continued to get worse. Pt denies injury. Pt ambulatory to ED Flex room.

## 2019-10-09 NOTE — ED Provider Notes (Signed)
[facility] Emergency Department Provider Note ____________________________________________  Time seen: 1357  I have reviewed the triage vital signs and the nursing notes.  HISTORY  Chief Complaint  Foot Pain and Ankle Pain  HPI Samantha Mendez is a 29 y.o. female presents herself to the ED for evaluation of several days of left foot pain.  Patient describes pain began as pain between the dorsal aspect of her second and third metatarsals.  She reports that pain resolved and she began experiencing lateral foot pain around the malleolus.  She denies any preceding injury, trauma, or fall.  She also denies any chronic ongoing problems with left foot.   Denies any swelling, erythema, warmth, or fluid to the foot.  She has been using ice with no benefit to her symptoms.  She has also attempted to use an Ace bandage for comfort but denies any significant benefit.  Past Medical History:  Diagnosis Date  . Asthma   . Back pain   . Migraine   . Sciatic leg pain     Patient Active Problem List   Diagnosis Date Noted  . Tobacco abuse 08/11/2019  . MDD (major depressive disorder), recurrent severe, without psychosis (HCC) 04/01/2019  . Major depressive disorder, recurrent severe without psychotic features (HCC) 04/01/2019  . Supervision of high risk pregnancy, antepartum, third trimester 08/09/2017  . Substance abuse affecting pregnancy in third trimester, antepartum 08/09/2017  . 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 Maternal obesity, antepartum 05/25/2017  . thick NT on first tri screen   . Mild intermittent asthma without complication 12/06/2014    Past Surgical History:  Procedure Laterality Date  . ADENOIDECTOMY    . ADENOIDECTOMY    . MYRINGOTOMY WITH TUBE PLACEMENT    . TONSILLECTOMY      Prior to Admission medications   Medication Sig Start Date End Date Taking? Authorizing Provider  hydrOXYzine (ATARAX/VISTARIL) 25 MG tablet Take 1  tablet (25 mg total) by mouth every 6 (six) hours as needed for anxiety or itching. 04/03/19   Antonieta Pert, MD  lidocaine (LIDODERM) 5 % Place 1 patch onto the skin daily for 5 days. Remove & Discard patch after 12 hours of wear each day. 10/09/19 10/14/19  Jaymie Misch, Charlesetta Ivory, PA-C  naproxen (NAPROSYN) 500 MG tablet Take 1 tablet (500 mg total) by mouth 2 (two) times daily with a meal. Patient not taking: Reported on 03/31/2019 08/23/18   Kem Boroughs B, FNP  risperiDONE (RISPERDAL) 0.5 MG tablet Take 1 tablet (0.5 mg total) by mouth at bedtime. 04/03/19   Antonieta Pert, MD  traZODone (DESYREL) 50 MG tablet Take 1 tablet (50 mg total) by mouth at bedtime as needed for sleep. 04/03/19   Antonieta Pert, MD    Allergies Ceclor [cefaclor], Keflex [cephalexin], Cephalosporins, Latex, Omnicef [cefdinir], and Sulfa antibiotics  History reviewed. No pertinent family history.  Social History Social History   Tobacco Use  . Smoking status: Current Every Day Smoker    Packs/day: 0.50    Types: Cigarettes  . Smokeless tobacco: Never Used  Substance Use Topics  . Alcohol use: Yes    Comment: twice weekly/ last 5/18  . Drug use: No    Comment: last use July/18    Review of Systems  Constitutional: Negative for fever. Cardiovascular: Negative for chest pain. Respiratory: Negative for shortness of breath. Musculoskeletal: Negative for back pain. Left foot pain  Skin: Negative for rash. Neurological: Negative for headaches, focal weakness  or numbness. ____________________________________________  PHYSICAL EXAM:  VITAL SIGNS: ED Triage Vitals  Enc Vitals Group     BP 10/09/19 1324 110/63     Pulse Rate 10/09/19 1324 62     Resp 10/09/19 1324 18     Temp 10/09/19 1324 98.5 F (36.9 C)     Temp Source 10/09/19 1324 Oral     SpO2 10/09/19 1324 99 %     Weight 10/09/19 1322 200 lb (90.7 kg)     Height 10/09/19 1322 5\' 5"  (1.651 m)     Head Circumference --      Peak Flow  --      Pain Score 10/09/19 1322 8     Pain Loc --      Pain Edu? --      Excl. in [place]? --     Constitutional: Alert and oriented. Well appearing and in no distress. Head: Normocephalic and atraumatic. Eyes: Conjunctivae are normal. Normal extraocular movements Cardiovascular: Normal rate, regular rhythm. Normal distal pulses. Respiratory: Normal respiratory effort. No wheezes/rales/rhonchi. Musculoskeletal: left ankle and foot without effusion, edema, or erythema. Normal ankle ROM. Nontender with normal range of motion in all extremities.  Neurologic:  Normal gait without ataxia. Normal gross sensation. No gross focal neurologic deficits are appreciated. Skin:  Skin is warm, dry and intact. No rash noted. ____________________________________________   RADIOLOGY  DG Left Foot Negative Plantar spur  I, Bular Hickok V Bacon-Edrick Whitehorn, personally viewed and evaluated these images (plain radiographs) as part of my medical decision making, as well as reviewing the written report by the radiologist. ____________________________________________  PROCEDURES  Post-op shoe Procedures ____________________________________________  INITIAL IMPRESSION / ASSESSMENT AND PLAN / ED COURSE  Patient with ED evaluation of left foot pain.  Exam is overall reassuring at this time.  No signs of effusion, edema, ecchymosis, or abrasion.  X-ray does not reveal any acute fracture or dislocation.  Patient is placed in a postop shoe for comfort, and referred to podiatry for further evaluation of her symptoms.  A prescription for Lidoderm patches provided for her benefit.  She can use over-the-counter Voltaren gel as well.  Samantha Mendez was evaluated in Emergency Department on 10/09/2019 for the symptoms described in the history of present illness. She was evaluated in the context of the global COVID-19 pandemic, which necessitated consideration that the patient might be at risk for infection with the SARS-CoV-2 virus  that causes COVID-19. Institutional protocols and algorithms that pertain to the evaluation of patients at risk for COVID-19 are in a state of rapid change based on information released by regulatory bodies including the CDC and federal and state organizations. These policies and algorithms were followed during the patient's care in the ED. ____________________________________________  FINAL CLINICAL IMPRESSION(S) / ED DIAGNOSES  Final diagnoses:  Sprain of left foot, initial encounter  Foot pain, left      Kylene Zamarron, Dannielle Karvonen, PA-C 10/09/19 2050    Arta Silence, MD 10/10/19 1505

## 2019-10-09 NOTE — ED Triage Notes (Signed)
Pt arrived via POV with reports of L foot pain for the past several days states the pain is moving up into ankle. Denies any swelling,  Has been using ice with no relief.

## 2019-10-27 ENCOUNTER — Other Ambulatory Visit: Payer: Self-pay | Admitting: Physician Assistant

## 2019-10-27 DIAGNOSIS — Z3042 Encounter for surveillance of injectable contraceptive: Secondary | ICD-10-CM

## 2019-10-27 MED ORDER — MEDROXYPROGESTERONE ACETATE 150 MG/ML IM SUSP
150.0000 mg | Freq: Once | INTRAMUSCULAR | Status: AC
  Administered 2019-10-28: 150 mg via INTRAMUSCULAR

## 2019-10-27 NOTE — Progress Notes (Signed)
Per chart review, patient had last RP 09/2017 and pap due 01/2020.  Seen 07/2019 for IUD removal and Depo start.  Provided that patient desires to continue with Depo and BP is normal, may continue with Depo on 10/28/2019.  Will need provider visit in June for pap and Depo.

## 2019-10-28 ENCOUNTER — Ambulatory Visit (LOCAL_COMMUNITY_HEALTH_CENTER): Payer: Medicaid Other

## 2019-10-28 ENCOUNTER — Other Ambulatory Visit: Payer: Self-pay

## 2019-10-28 VITALS — BP 116/69 | Ht 65.0 in | Wt 203.0 lb

## 2019-10-28 DIAGNOSIS — Z30013 Encounter for initial prescription of injectable contraceptive: Secondary | ICD-10-CM | POA: Diagnosis not present

## 2019-10-28 DIAGNOSIS — Z3009 Encounter for other general counseling and advice on contraception: Secondary | ICD-10-CM

## 2019-10-28 NOTE — Progress Notes (Signed)
Patient request STD visit d/t recent break up; appt scheduled. Co needs next Depo and pap in June 2021. Depo give per  Rolley Sims PA order; tolerated well Richmond Campbell, RN

## 2019-11-01 ENCOUNTER — Other Ambulatory Visit: Payer: Self-pay

## 2019-11-01 ENCOUNTER — Ambulatory Visit: Payer: Medicaid Other | Admitting: Family Medicine

## 2019-11-01 ENCOUNTER — Encounter: Payer: Self-pay | Admitting: Family Medicine

## 2019-11-01 DIAGNOSIS — Z87898 Personal history of other specified conditions: Secondary | ICD-10-CM | POA: Insufficient documentation

## 2019-11-01 DIAGNOSIS — Z113 Encounter for screening for infections with a predominantly sexual mode of transmission: Secondary | ICD-10-CM

## 2019-11-01 DIAGNOSIS — F1290 Cannabis use, unspecified, uncomplicated: Secondary | ICD-10-CM | POA: Insufficient documentation

## 2019-11-01 DIAGNOSIS — F1991 Other psychoactive substance use, unspecified, in remission: Secondary | ICD-10-CM | POA: Insufficient documentation

## 2019-11-01 LAB — WET PREP FOR TRICH, YEAST, CLUE
Trichomonas Exam: NEGATIVE
Yeast Exam: NEGATIVE
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
## 2019-11-01 NOTE — Progress Notes (Signed)
Wet mount reviewed, patient asymptomatic per provider, no treatment indicated.Burt Knack, RN

## 2019-11-01 NOTE — Progress Notes (Signed)
Patient here for STD screening. Using Depo. Last Depo given 10/28/2019.Marland KitchenBurt Knack, RN

## 2019-11-07 LAB — HM HIV SCREENING LAB: HM HIV Screening: NEGATIVE

## 2019-11-07 LAB — HEPATITIS B SURFACE ANTIGEN

## 2019-11-07 LAB — HM HEPATITIS C SCREENING LAB: HM Hepatitis Screen: NEGATIVE

## 2020-01-18 ENCOUNTER — Ambulatory Visit: Payer: Medicaid Other

## 2020-01-26 ENCOUNTER — Ambulatory Visit (LOCAL_COMMUNITY_HEALTH_CENTER): Payer: Medicaid Other | Admitting: Physician Assistant

## 2020-01-26 ENCOUNTER — Other Ambulatory Visit: Payer: Self-pay

## 2020-01-26 ENCOUNTER — Telehealth: Payer: Self-pay

## 2020-01-26 VITALS — BP 125/72 | Ht 65.0 in | Wt 199.0 lb

## 2020-01-26 DIAGNOSIS — Z0000 Encounter for general adult medical examination without abnormal findings: Secondary | ICD-10-CM

## 2020-01-26 DIAGNOSIS — Z3009 Encounter for other general counseling and advice on contraception: Secondary | ICD-10-CM | POA: Diagnosis not present

## 2020-01-26 DIAGNOSIS — Z3042 Encounter for surveillance of injectable contraceptive: Secondary | ICD-10-CM

## 2020-01-26 DIAGNOSIS — Z30013 Encounter for initial prescription of injectable contraceptive: Secondary | ICD-10-CM | POA: Diagnosis not present

## 2020-01-26 MED ORDER — MEDROXYPROGESTERONE ACETATE 150 MG/ML IM SUSP
150.0000 mg | Freq: Once | INTRAMUSCULAR | Status: AC
  Administered 2020-01-26: 150 mg via INTRAMUSCULAR

## 2020-01-26 NOTE — Progress Notes (Signed)
Depo given, left deltoid (per patient request), tolerated well, next Depo card given. Patient aware she will need Pap and PE with next Depo.Burt Knack, RN

## 2020-01-26 NOTE — Telephone Encounter (Signed)
Call to client-informed provider out due to illness and will not be having a physical today.  Desires to keep appt to receive Depo Sharlette Dense, RN

## 2020-01-26 NOTE — Progress Notes (Signed)
Patient here for Pap and Depo. Last PE was 09/2017. Last Depo was 10/28/2019, patient is 12 6/7 since last Depo. Patient here with her 3 children.Burt Knack, RN

## 2020-01-27 ENCOUNTER — Encounter: Payer: Self-pay | Admitting: Physician Assistant

## 2020-01-27 NOTE — Progress Notes (Signed)
[place] problem visit  [place] Department  Subjective:  Samantha Mendez is a 29 y.o. being seen today to continue with Depo.  Chief Complaint  Patient presents with  . Contraception    depo    HPI  Patient originally scheduled for RP and Depo but due to provider out sick was changed to method only visit today.  However, patient had already completed routine health survey.  States that she has not had any changes to her family history.  Reports that the items on her ROS regarding blurry vision, headaches, bruising easily, hot and cold intolerance, and weight fluctuation are normal for her and without changes.  Reports that she has a good support system and crisis #s to use if needed for her anxiety/depression and PTSD.  States that she has tried meds but they do not allow her to function and she does not like the way they make her feel so she has her support system and a counselor to see if she needs to do so. Denies suicidal or homicidal ideation or plan today and states that she will call crisis # if needed.   Would like to continue with Depo today and states that she would like to do her physical exam with pelvic/pap at her next visit.     Does the patient have a current or past history of drug use? Yes   No components found for: HCV]   Health Maintenance Due  Topic Date Due  . COVID-19 Vaccine (1) Never done  . TETANUS/TDAP  Never done  . PAP-Cervical Cytology Screening  Never done  . PAP SMEAR-Modifier  Never done    Review of Systems  All other systems reviewed and are negative.     See above  The following portions of the patient's history were reviewed and updated as appropriate: allergies, current medications, past family history, past medical history, past social history, past surgical history and problem list. Problem list updated.   See flowsheet for other program required questions.  Objective:   Vitals:   01/26/20 1332  BP:  125/72  Weight: 199 lb (90.3 kg)  Height: 5\' 5"  (1.651 m)    Physical Exam Vitals and nursing note reviewed.  Constitutional:      General: She is not in acute distress.    Appearance: Normal appearance.  HENT:     Head: Normocephalic and atraumatic.  Pulmonary:     Effort: Pulmonary effort is normal.  Neurological:     Mental Status: She is alert and oriented to person, place, and time.  Psychiatric:        Mood and Affect: Mood normal.        Behavior: Behavior normal.        Thought Content: Thought content normal.        Judgment: Judgment normal.       Assessment and Plan:  Samantha Mendez is a 29 y.o. female presenting to the [facility] Department for a Women's Health problem visit  1. Encounter for counseling regarding contraception Reviewed with patient normal SE of Depo and when to call clinic for irregular bleeding. Rec condoms with all sex for STD protection.  2. Surveillance for Depo-Provera contraception OK for Depo 150mg  IM today. - medroxyPROGESTERone (DEPO-PROVERA) injection 150 mg  3. Routine check-up Reviewed RHS info with patient today and will not need done at next appointment unless there has been a change. Enc patient to follow up with  PCP/establish with PCP for primary care concerns, illness and to follow up with chronic conditons. Enc MVI 1 po daily. Enc to continue to follow up with counselor as needed for anxiety/depression/PTSD.     No follow-ups on file.  No future appointments.  Matt Holmes, PA

## 2020-04-13 ENCOUNTER — Ambulatory Visit (LOCAL_COMMUNITY_HEALTH_CENTER): Payer: Medicaid Other | Admitting: Advanced Practice Midwife

## 2020-04-13 ENCOUNTER — Other Ambulatory Visit: Payer: Self-pay

## 2020-04-13 ENCOUNTER — Encounter: Payer: Self-pay | Admitting: Advanced Practice Midwife

## 2020-04-13 VITALS — BP 105/66 | Ht 65.0 in | Wt 199.8 lb

## 2020-04-13 DIAGNOSIS — Z30013 Encounter for initial prescription of injectable contraceptive: Secondary | ICD-10-CM

## 2020-04-13 DIAGNOSIS — T1491XA Suicide attempt, initial encounter: Secondary | ICD-10-CM

## 2020-04-13 DIAGNOSIS — F419 Anxiety disorder, unspecified: Secondary | ICD-10-CM | POA: Insufficient documentation

## 2020-04-13 DIAGNOSIS — F17200 Nicotine dependence, unspecified, uncomplicated: Secondary | ICD-10-CM

## 2020-04-13 DIAGNOSIS — T7411XS Adult physical abuse, confirmed, sequela: Secondary | ICD-10-CM

## 2020-04-13 DIAGNOSIS — F319 Bipolar disorder, unspecified: Secondary | ICD-10-CM | POA: Insufficient documentation

## 2020-04-13 DIAGNOSIS — F1991 Other psychoactive substance use, unspecified, in remission: Secondary | ICD-10-CM

## 2020-04-13 DIAGNOSIS — Z3009 Encounter for other general counseling and advice on contraception: Secondary | ICD-10-CM

## 2020-04-13 DIAGNOSIS — Z3042 Encounter for surveillance of injectable contraceptive: Secondary | ICD-10-CM

## 2020-04-13 DIAGNOSIS — F332 Major depressive disorder, recurrent severe without psychotic features: Secondary | ICD-10-CM

## 2020-04-13 DIAGNOSIS — T7411XA Adult physical abuse, confirmed, initial encounter: Secondary | ICD-10-CM

## 2020-04-13 HISTORY — DX: Adult physical abuse, confirmed, initial encounter: T74.11XA

## 2020-04-13 LAB — WET PREP FOR TRICH, YEAST, CLUE
Trichomonas Exam: NEGATIVE
Yeast Exam: NEGATIVE

## 2020-04-13 MED ORDER — MEDROXYPROGESTERONE ACETATE 150 MG/ML IM SUSP
150.0000 mg | INTRAMUSCULAR | Status: AC
  Administered 2020-04-13 – 2020-12-07 (×3): 150 mg via INTRAMUSCULAR

## 2020-04-13 NOTE — Progress Notes (Signed)
[place] results reviewed. Per standing orders no treatment indicated. Rylea Selway, RN  
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
## 2020-04-13 NOTE — Progress Notes (Signed)
Here today for a PE and Depo. Last PE here was 09/28/2017. Last Pap Smear was 01/28/2017. Last Depo was 01/26/2020 (11.1 weeks.) Tawny Hopping, RN

## 2020-04-18 LAB — IGP, RFX APTIMA HPV ASCU: PAP Smear Comment: 0

## 2020-06-29 ENCOUNTER — Ambulatory Visit: Payer: Medicaid Other

## 2020-08-07 ENCOUNTER — Other Ambulatory Visit: Payer: Self-pay

## 2020-08-07 ENCOUNTER — Ambulatory Visit (LOCAL_COMMUNITY_HEALTH_CENTER): Payer: Medicaid Other | Admitting: Family Medicine

## 2020-08-07 VITALS — BP 103/60 | HR 61 | Ht 65.0 in | Wt 204.2 lb

## 2020-08-07 DIAGNOSIS — Z30013 Encounter for initial prescription of injectable contraceptive: Secondary | ICD-10-CM

## 2020-08-07 DIAGNOSIS — Z3009 Encounter for other general counseling and advice on contraception: Secondary | ICD-10-CM | POA: Diagnosis not present

## 2020-08-07 NOTE — Progress Notes (Signed)
Presents for Depo only visit at 16 weeks and 4 days past last Depo injection on 04/13/2020. Counseled on recommended schedule of every 11 - 13 weeks in order to maintain hormone levels and minimize chances of breakthrough bleeding. Depo administered today per standing order of Dr. Alvester Morin as past 13 weeks (as ordered by Hazle Coca CNM on 04/13/2020). Client tolerated Depo without complaint. Jossie Ng, RN

## 2020-08-09 NOTE — Progress Notes (Signed)
Attestation of Attending Supervision of clinical support staff: I agree with the care provided to this patient and was available for any consultation.  I have reviewed the RN's note and chart. I was available for consult and to see the patient if needed.   Dafne Nield Niles Miabella Shannahan, MD, MPH, ABFM Attending Physician Faculty Practice- [facility]  

## 2020-10-23 ENCOUNTER — Ambulatory Visit: Payer: Medicaid Other

## 2020-12-07 ENCOUNTER — Other Ambulatory Visit: Payer: Self-pay

## 2020-12-07 ENCOUNTER — Ambulatory Visit (LOCAL_COMMUNITY_HEALTH_CENTER): Payer: Medicaid Other | Admitting: Advanced Practice Midwife

## 2020-12-07 VITALS — BP 107/66 | Ht 65.0 in | Wt 204.2 lb

## 2020-12-07 DIAGNOSIS — Z3009 Encounter for other general counseling and advice on contraception: Secondary | ICD-10-CM | POA: Diagnosis not present

## 2020-12-07 DIAGNOSIS — Z30013 Encounter for initial prescription of injectable contraceptive: Secondary | ICD-10-CM

## 2020-12-07 DIAGNOSIS — Z3042 Encounter for surveillance of injectable contraceptive: Secondary | ICD-10-CM

## 2020-12-07 NOTE — Progress Notes (Signed)
Contraception/Family Planning VISIT ENCOUNTER NOTE  Subjective:   Samantha Mendez is a 30 y.o.MWF [phone] female here for reproductive life counseling.  Desires DMPA for BCM.  Reports she does not want a pregnancy in the next year. Denies abnormal vaginal bleeding, discharge, pelvic pain, problems with intercourse or other gynecologic concerns. Pt was here 04/13/20 for physical and pap.  Last DMPA 08/07/20 (17 3/7). Last sex >9 mo ago "I'm asexual". LMP 11/28/20. Last pap 04/13/20 neg.   Gynecologic History Patient's last menstrual period was 11/28/2020. Contraception: DMPA with last one 08/07/20 (17 3/7)  Health Maintenance Due  Topic Date Due  . COVID-19 Vaccine (1) Never done  . PAP-Cervical Cytology Screening  Never done     The following portions of the patient's history were reviewed and updated as appropriate: allergies, current medications, past family history, past medical history, past social history, past surgical history and problem list.  Review of Systems Pertinent items are noted in HPI.   Objective:  BP 107/66   Ht 5\' 5"  (1.651 m)   Wt 204 lb 3.2 oz (92.6 kg)   LMP 11/28/2020   BMI 33.98 kg/m  Gen: well appearing, NAD HEENT: no scleral icterus CV: RR Lung: Normal WOB Ext: warm well perfused     Assessment and Plan:   Contraception counseling: Reviewed all forms of birth control options in the tiered based approach. available including abstinence; over the counter/barrier methods; hormonal contraceptive medication including pill, patch, ring, injection,contraceptive implant, ECP; hormonal and nonhormonal IUDs; permanent sterilization options including vasectomy and the various tubal sterilization modalities. Risks, benefits, and typical effectiveness rates were reviewed.  Questions were answered.  Written information was also given to the patient to review.  Patient desires DMPA, this was prescribed for patient. She will follow up in  DMPA for surveillance.  She was  told to call with any further questions, or with any concerns about this method of contraception.  Emphasized use of condoms 100% of the time for STI prevention.  Patient was offered ECP. ECP was not accepted by the patient. ECP counseling was not given - see RN documentation  1. Encounter for surveillance of injectable contraceptive May have DMPA 150 mg IM per 04/13/20 order   Please refer to After Visit Summary for other counseling recommendations.   No follow-ups on file.  06/13/20, CNM [facility] DEPARTMENT

## 2020-12-07 NOTE — Progress Notes (Signed)
Depo given, left deltoid, tolerated well, next Depo card given.Samantha Nordhoff Brewer-Jensen, RN 

## 2021-02-22 ENCOUNTER — Ambulatory Visit: Payer: Medicaid Other

## 2023-12-20 ENCOUNTER — Emergency Department
Admission: EM | Admit: 2023-12-20 | Discharge: 2023-12-20 | Disposition: A | Discharge status: Home / Self Care | Payer: Self-pay

## 2023-12-20 ENCOUNTER — Other Ambulatory Visit: Payer: Self-pay

## 2023-12-20 DIAGNOSIS — L03119 Cellulitis of unspecified part of limb: Secondary | ICD-10-CM

## 2023-12-20 DIAGNOSIS — W57XXXA Bitten or stung by nonvenomous insect and other nonvenomous arthropods, initial encounter: Secondary | ICD-10-CM | POA: Insufficient documentation

## 2023-12-20 DIAGNOSIS — L03116 Cellulitis of left lower limb: Secondary | ICD-10-CM | POA: Insufficient documentation

## 2023-12-20 MED ORDER — HYDROCORTISONE SOD SUC (PF) 100 MG IJ SOLR
100.0000 mg | Freq: Once | INTRAMUSCULAR | Status: AC
  Administered 2023-12-20: 100 mg via INTRAMUSCULAR
  Filled 2023-12-20: qty 2

## 2023-12-20 MED ORDER — CETIRIZINE HCL 10 MG PO TABS: 10.0000 mg | ORAL_TABLET | Freq: Every day | ORAL | 0 refills | Status: AC

## 2023-12-20 MED ORDER — FLUCONAZOLE 150 MG PO TABS: 150.0000 mg | ORAL_TABLET | Freq: Every day | ORAL | 0 refills | Status: DC

## 2023-12-20 MED ORDER — AMOXICILLIN-POT CLAVULANATE 875-125 MG PO TABS: 1.0000 | ORAL_TABLET | Freq: Two times a day (BID) | ORAL | 0 refills | Status: AC

## 2023-12-20 MED ORDER — IBUPROFEN 600 MG PO TABS
600.0000 mg | ORAL_TABLET | Freq: Once | ORAL | Status: AC
  Administered 2023-12-20: 600 mg via ORAL
  Filled 2023-12-20: qty 1

## 2023-12-20 NOTE — Discharge Instructions (Addendum)
 You have been diagnosed with cellulitis secondary to insect bite.  Take Augmentin 1 tablet by mouth every 12 hours after main meals for 7 days, please take fluconazole 1 tablet by mouth to prevent vaginal fungal infection.  Please take cetirizine 1 tablet by mouth daily.  Take ibuprofen  400 mg as needed for pain.  Can apply cold pack in the area.  Come back to ED or go to [facility] if you notice the red area is growing or not improving.

## 2023-12-20 NOTE — ED Provider Notes (Signed)
 [facility] Provider Note    Event Date/Time   First MD Initiated Contact with Patient 12/20/23 1858     (approximate)   History   Insect Bite    HPI  Samantha URBANIAK is a 33 y.o. female    with a past medical history of UTI, ganglion cyst, right foot pain, lower abdominal pain, left ankle pain, upper respiratory infection,who presents to the ED complaining of spider bite. According to the patient, symptoms started 24 hours ago, noticing left posterior area of the tight with circular erythema, tender, warm.  States leg is tender when walking.  Patient denies wheezing, shortness of breath, difficulty breathing.  Today patient marked with a pen the erythematosus area, with evident growing of the erythema in the last 4 hours.  Patient works as a Emergency planning/management officer.  Patient does not have insurance      Physical Exam   Triage Vital Signs: ED Triage Vitals  Encounter Vitals Group     BP 12/20/23 1804 114/81     Systolic BP Percentile --      Diastolic BP Percentile --      Pulse Rate 12/20/23 1804 93     Resp 12/20/23 1804 16     Temp 12/20/23 1804 98.3 F (36.8 C)     Temp Source 12/20/23 1804 Oral     SpO2 12/20/23 1804 100 %     Weight 12/20/23 1805 205 lb (93 kg)     Height 12/20/23 1805 5\' 5"  (1.651 m)     Head Circumference --      Peak Flow --      Pain Score 12/20/23 1804 7     Pain Loc --      Pain Education --      Exclude from Growth Chart --     Most recent vital signs: Vitals:   12/20/23 1804  BP: 114/81  Pulse: 93  Resp: 16  Temp: 98.3 F (36.8 C)  SpO2: 100%     Constitutional: Alert, NAD. Able to speak in complete sentences without cough or dyspnea  Eyes: Conjunctivae are normal.  Head: Atraumatic. Nose: No congestion/rhinnorhea. Mouth/Throat: Mucous membranes are moist.   Neck: Painless ROM. Supple. No JVD, nodes, thyromegaly  Cardiovascular:   Good peripheral circulation.RRR no murmurs, gallops, rubs  Respiratory: Normal  respiratory effort.  No retractions. Clear to auscultation bilaterally without wheezing or crackles  Gastrointestinal: Soft and nontender.  Musculoskeletal:  no deformity Left tight: Posterior proximal area, erythematosus circular area about 15 cm, with central lesion.  Area is warm, tender to palpation Neurologic:  MAE spontaneously. No gross focal neurologic deficits are appreciated.  Skin:  Skin is warm, dry and intact. No rash noted. Psychiatric: Mood and affect are normal. Speech and behavior are normal.    ED Results / Procedures / Treatments   Labs (all labs ordered are listed, but only abnormal results are displayed) Labs Reviewed - No data to display   EKG  RADIOLOGY  PROCEDURES:  Critical Care performed:   Procedures   MEDICATIONS ORDERED IN ED: Medications  ibuprofen  (ADVIL ) tablet 600 mg (has no administration in time range)  hydrocortisone sodium succinate (SOLU-CORTEF) 100 MG injection 100 mg (has no administration in time range)      IMPRESSION / MDM / ASSESSMENT AND PLAN / ED COURSE  I reviewed the triage vital signs and the nursing notes.  Differential diagnosis includes, but is not limited to, insect bite, cellulitis, abscess,  Patient's  presentation is most consistent with acute, uncomplicated illness.  Patient's diagnosis is consistent with cellulitis secondary to insect bite on the left tight.  Exam is reassuring. I did review the patient's allergies and medications.The patient is in stable and satisfactory condition for discharge home during admission patient received ibuprofen , hydrocortisone IM.  Patient will be discharged home with prescriptions for cetirizine, Augmentin, fluconazole.  Patient states when she takes antibiotics she will have vaginal candidiasis.  Recommended to the patient [facility] for uninsured patients.  Patient is to follow up with [facility] as needed or otherwise directed. Patient is given ED precautions to return to  the ED for any worsening or new symptoms. Discussed plan of care with patient, answered all of patient's questions, Patient agreeable to plan of care. Advised patient to take medications according to the instructions on the label. Discussed possible side effects of new medications. Patient verbalized understanding.    FINAL CLINICAL IMPRESSION(S) / ED DIAGNOSES   Final diagnoses:  Cellulitis of lower extremity, unspecified laterality     Rx / DC Orders   ED Discharge Orders          Ordered    amoxicillin-clavulanate (AUGMENTIN) 875-125 MG tablet  2 times daily        12/20/23 1927    fluconazole (DIFLUCAN) 150 MG tablet  Daily        12/20/23 1928    cetirizine (ZYRTEC ALLERGY) 10 MG tablet  Daily        12/20/23 1928             Note:  This document was prepared using Dragon voice recognition software and may include unintentional dictation errors.   Awilda Lennox, PA-C 12/20/23 1930    Collis Deaner, MD 12/21/23 [phone]

## 2023-12-20 NOTE — ED Triage Notes (Addendum)
 Pt reports possible spider bite to L inner thigh. She noticed it as a small red mark yesterday evening. This morning it was large, and she marked the red border around 1430. The erythema has extended beyond that border and the area is markedly swollen. T

## 2024-01-20 ENCOUNTER — Telehealth: Payer: Self-pay | Admitting: Family Medicine

## 2024-01-20 DIAGNOSIS — W548XXA Other contact with dog, initial encounter: Secondary | ICD-10-CM

## 2024-01-20 DIAGNOSIS — L03113 Cellulitis of right upper limb: Secondary | ICD-10-CM

## 2024-01-20 MED ORDER — FLUCONAZOLE 150 MG PO TABS: 150.0000 mg | ORAL_TABLET | Freq: Once | ORAL | 0 refills | Status: AC

## 2024-01-20 MED ORDER — AMOXICILLIN-POT CLAVULANATE 875-125 MG PO TABS: 1.0000 | ORAL_TABLET | Freq: Two times a day (BID) | ORAL | 0 refills | Status: AC
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
## 2024-01-20 NOTE — Patient Instructions (Signed)
 Samantha Mendez, thank you for joining Samantha Roys, PA-C for today's virtual visit.  While this provider is not your primary care provider (PCP), if your PCP is located in our provider database this encounter information will be shared with them immediately following your visit.   A [facility] MyChart account gives you access to today's visit and all your visits, tests, and labs performed at [facility]  click here if you don't have a [facility] MyChart account or go to [email or web address]  Consent: (Patient) Samantha Mendez provided verbal consent for this virtual visit at the beginning of the encounter.  Current Medications:  Current Outpatient Medications:    amoxicillin -clavulanate (AUGMENTIN ) 875-125 MG tablet, Take 1 tablet by mouth 2 (two) times daily for 7 days., Disp: 14 tablet, Rfl: 0   fluconazole  (DIFLUCAN ) 150 MG tablet, Take 1 tablet (150 mg total) by mouth once for 1 dose., Disp: 1 tablet, Rfl: 0   cetirizine  (ZYRTEC  ALLERGY) 10 MG tablet, Take 1 tablet (10 mg total) by mouth daily., Disp: 10 tablet, Rfl: 0   hydrOXYzine  (ATARAX /VISTARIL ) 25 MG tablet, Take 1 tablet (25 mg total) by mouth every 6 (six) hours as needed for anxiety or itching. (Patient not taking: No sig reported), Disp: 30 tablet, Rfl: 0   naproxen  (NAPROSYN ) 500 MG tablet, Take 1 tablet (500 mg total) by mouth 2 (two) times daily with a meal. (Patient not taking: No sig reported), Disp: 30 tablet, Rfl: 0   Prenatal Vit-Fe Fumarate-FA (MULTIVITAMIN-PRENATAL) 27-0.8 MG TABS tablet, Take 1 tablet by mouth daily at 12 noon. (Patient not taking: Reported on 12/07/2020), Disp: , Rfl:    risperiDONE  (RISPERDAL ) 0.5 MG tablet, Take 1 tablet (0.5 mg total) by mouth at bedtime. (Patient not taking: No sig reported), Disp: 30 tablet, Rfl: 0   traZODone  (DESYREL ) 50 MG tablet, Take 1 tablet (50 mg total) by mouth at bedtime as needed for sleep. (Patient not taking: No sig reported), Disp: 15 tablet, Rfl: 0    Medications ordered in this encounter:  Meds ordered this encounter  Medications   amoxicillin -clavulanate (AUGMENTIN ) 875-125 MG tablet    Sig: Take 1 tablet by mouth 2 (two) times daily for 7 days.    Dispense:  14 tablet    Refill:  0   fluconazole  (DIFLUCAN ) 150 MG tablet    Sig: Take 1 tablet (150 mg total) by mouth once for 1 dose.    Dispense:  1 tablet    Refill:  0     *If you need refills on other medications prior to your next appointment, please contact your pharmacy*  Follow-Up: Call back or seek an in-person evaluation if the symptoms worsen or if the condition fails to improve as anticipated.  [facility] [phone]  Other Instructions Cellulitis, Adult  Cellulitis is a skin infection. The infected area is usually warm, red, swollen, and tender. It most commonly occurs on the lower body, such as the legs, feet, and toes, but this condition can occur on any part of the body. The infection can travel to the muscles, blood, and underlying tissue and become life-threatening without treatment. It is important to get medical treatment right away for this condition. What are the causes? Cellulitis is caused by bacteria. The bacteria enter through a break in the skin, such as a cut, burn, insect or animal bite, open sore, or crack. What increases the risk? This condition is more likely to occur in people who: Have a weak  body's defense system (immune system). Are older than 32 years old. Have diabetes. Have a type of long-term (chronic) liver disease (cirrhosis) or kidney disease. Are obese. Have a skin condition such as: An itchy rash, such as eczema or psoriasis. A fungal rash on the feet or in skinfolds. Blistering rashes, such as shingles or chickenpox. Slow movement of blood in the veins (venous stasis). Fluid buildup below the skin (edema). Have open wounds on the skin, such as cuts, puncture wounds, burns, bites, scrapes, tattoos, piercings,  or wounds from surgery. Have had radiation therapy. Use IV drugs. What are the signs or symptoms? Symptoms of this condition include: Skin that looks red, purple, or slightly darker than your usual skin color. Streaks or spots on the skin. Swollen area of the skin. Tenderness or pain when an area of the skin is touched. Warm skin. Fever or chills. Blisters. Tiredness (fatigue). How is this diagnosed? This condition is diagnosed based on a medical history and physical exam. You may also have tests, including: Blood tests. Imaging tests. Tests on a sample of fluid taken from the wound (wound culture). How is this treated? Treatment for this condition may include: Medicines. These may include antibiotics or medicines to treat allergies (antihistamines). Rest. Applying cold or warm wet cloths (compresses) to the skin. If the condition is severe, you may need to stay in the hospital and get antibiotics through an IV. The infection usually starts to get better within 1-2 days of treatment. Follow these instructions at home: Medicines Take over-the-counter and prescription medicines only as told by your health care provider. If you were prescribed antibiotics, take them as told by your provider. Do not stop using the antibiotic even if you start to feel better. General instructions Drink enough fluid to keep your pee (urine) pale yellow. Do not touch or rub the infected area. Raise (elevate) the infected area above the level of your heart while you are sitting or lying down. Return to your normal activities as told by your provider. Ask your provider what activities are safe for you. Apply warm or cold compresses to the affected area as told by your provider. Keep all follow-up visits. Your provider will need to make sure that a more serious infection is not developing. Contact a health care provider if: You have a fever. Your symptoms do not improve within 1-2 days of starting  treatment or you develop new symptoms. Your bone or joint underneath the infected area becomes painful after the skin has healed. Your infection returns in the same area or another area. Signs of this may include: You notice a swollen bump in the infected area. Your red area gets larger, turns dark in color, or becomes more painful. Drainage increases. Pus or a bad smell develops in your infected area. You have more pain. You feel ill and have muscle aches and weakness. You develop vomiting or diarrhea that will not go away. Get help right away if: You notice red streaks coming from the infected area. You notice the skin turns purple or black and falls off. This symptom may be an emergency. Get help right away. Call 911. Do not wait to see if the symptom will go away. Do not drive yourself to the hospital. This information is not intended to replace advice given to you by your health care provider. Make sure you discuss any questions you have with your health care provider. Document Revised: 03/25/2022 Document Reviewed: 03/25/2022 Elsevier Patient Education  2024 [employer].  If you have been instructed to have an in-person evaluation today at a local Urgent Care facility, please use the link below. It will take you to a list of all of our available [facility], including address, phone number and hours of operation. Please do not delay care.  [facility]  If you or a family member do not have a primary care provider, use the link below to schedule a visit and establish care. When you choose a [facility] primary care physician or advanced practice provider, you gain a long-term partner in health. Find a Primary Care Provider  Learn more about [facility]'s in-office and virtual care options: [facility] - Get Care Now

## 2024-07-08 ENCOUNTER — Emergency Department
Admission: EM | Admit: 2024-07-08 | Discharge: 2024-07-08 | Disposition: A | Discharge status: Home / Self Care | Payer: Self-pay

## 2024-07-08 ENCOUNTER — Emergency Department: Payer: Self-pay

## 2024-07-08 ENCOUNTER — Other Ambulatory Visit: Payer: Self-pay

## 2024-07-08 DIAGNOSIS — W19XXXA Unspecified fall, initial encounter: Secondary | ICD-10-CM

## 2024-07-08 DIAGNOSIS — F17200 Nicotine dependence, unspecified, uncomplicated: Secondary | ICD-10-CM | POA: Insufficient documentation

## 2024-07-08 DIAGNOSIS — J4520 Mild intermittent asthma, uncomplicated: Secondary | ICD-10-CM | POA: Insufficient documentation

## 2024-07-08 DIAGNOSIS — S5011XA Contusion of right forearm, initial encounter: Secondary | ICD-10-CM | POA: Insufficient documentation

## 2024-07-08 DIAGNOSIS — Y9301 Activity, walking, marching and hiking: Secondary | ICD-10-CM | POA: Insufficient documentation

## 2024-07-08 DIAGNOSIS — W010XXA Fall on same level from slipping, tripping and stumbling without subsequent striking against object, initial encounter: Secondary | ICD-10-CM | POA: Insufficient documentation

## 2024-07-08 NOTE — Discharge Instructions (Signed)
 Your x-ray was normal.  Rest, ice, elevate your arm.  Please follow up with your outpatient provider. It was a pleasure caring for you today.
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
## 2024-07-08 NOTE — ED Triage Notes (Signed)
 Pt to ED for R arm pain after mechanical fall today while hiking. Pt has small bruises to R forearm, no visible deformity. Complains of R knee pain also. Ambulatory with steady gait. Pt walked back to her car after the fall.
